# Patient Record
Sex: Male | Born: 1985 | Race: Black or African American | Hispanic: No | Marital: Married | Smoking: Former smoker
Health system: Southern US, Community
[De-identification: ages and names within clinical notes are randomized; demographics above are authoritative.]

## PROBLEM LIST (undated history)

## (undated) DIAGNOSIS — L509 Urticaria, unspecified: Secondary | ICD-10-CM

## (undated) DIAGNOSIS — F319 Bipolar disorder, unspecified: Secondary | ICD-10-CM

## (undated) DIAGNOSIS — R569 Unspecified convulsions: Secondary | ICD-10-CM

## (undated) DIAGNOSIS — J45909 Unspecified asthma, uncomplicated: Secondary | ICD-10-CM

## (undated) HISTORY — DX: Urticaria, unspecified: L50.9

## (undated) HISTORY — DX: Bipolar disorder, unspecified: F31.9

---

## 2011-03-12 ENCOUNTER — Emergency Department (HOSPITAL_COMMUNITY)
Admission: EM | Admit: 2011-03-12 | Discharge: 2011-03-12 | Disposition: A | Discharge status: Home / Self Care | Payer: Self-pay | Attending: Emergency Medicine | Admitting: Emergency Medicine

## 2011-03-12 DIAGNOSIS — R0609 Other forms of dyspnea: Secondary | ICD-10-CM | POA: Insufficient documentation

## 2011-03-12 DIAGNOSIS — J309 Allergic rhinitis, unspecified: Secondary | ICD-10-CM | POA: Insufficient documentation

## 2011-03-12 DIAGNOSIS — R0989 Other specified symptoms and signs involving the circulatory and respiratory systems: Secondary | ICD-10-CM | POA: Insufficient documentation

## 2011-03-12 DIAGNOSIS — J45901 Unspecified asthma with (acute) exacerbation: Secondary | ICD-10-CM | POA: Insufficient documentation

## 2011-03-12 DIAGNOSIS — J3489 Other specified disorders of nose and nasal sinuses: Secondary | ICD-10-CM | POA: Insufficient documentation

## 2011-06-16 ENCOUNTER — Emergency Department (HOSPITAL_COMMUNITY)
Admission: EM | Admit: 2011-06-16 | Discharge: 2011-06-16 | Disposition: A | Discharge status: Home / Self Care | Payer: Medicaid Other | Attending: Emergency Medicine | Admitting: Emergency Medicine

## 2011-06-16 DIAGNOSIS — L02219 Cutaneous abscess of trunk, unspecified: Secondary | ICD-10-CM | POA: Insufficient documentation

## 2011-06-16 DIAGNOSIS — J45909 Unspecified asthma, uncomplicated: Secondary | ICD-10-CM | POA: Insufficient documentation

## 2011-06-16 DIAGNOSIS — L03319 Cellulitis of trunk, unspecified: Secondary | ICD-10-CM | POA: Insufficient documentation

## 2015-09-08 ENCOUNTER — Encounter (HOSPITAL_COMMUNITY): Payer: Self-pay | Admitting: Emergency Medicine

## 2015-09-08 ENCOUNTER — Emergency Department (HOSPITAL_COMMUNITY): Payer: Managed Care, Other (non HMO)

## 2015-09-08 ENCOUNTER — Emergency Department (HOSPITAL_COMMUNITY)
Admission: EM | Admit: 2015-09-08 | Discharge: 2015-09-08 | Disposition: A | Discharge status: Home / Self Care | Payer: Managed Care, Other (non HMO) | Attending: Emergency Medicine | Admitting: Emergency Medicine

## 2015-09-08 DIAGNOSIS — M545 Low back pain: Secondary | ICD-10-CM | POA: Diagnosis present

## 2015-09-08 DIAGNOSIS — J45909 Unspecified asthma, uncomplicated: Secondary | ICD-10-CM | POA: Insufficient documentation

## 2015-09-08 DIAGNOSIS — Z880 Allergy status to penicillin: Secondary | ICD-10-CM | POA: Insufficient documentation

## 2015-09-08 DIAGNOSIS — H5713 Ocular pain, bilateral: Secondary | ICD-10-CM | POA: Insufficient documentation

## 2015-09-08 DIAGNOSIS — M5136 Other intervertebral disc degeneration, lumbar region: Secondary | ICD-10-CM | POA: Diagnosis not present

## 2015-09-08 DIAGNOSIS — R569 Unspecified convulsions: Secondary | ICD-10-CM | POA: Diagnosis not present

## 2015-09-08 HISTORY — DX: Unspecified convulsions: R56.9

## 2015-09-08 HISTORY — DX: Unspecified asthma, uncomplicated: J45.909

## 2015-09-08 MED ORDER — DICLOFENAC SODIUM 75 MG PO TBEC: 75.0000 mg | DELAYED_RELEASE_TABLET | Freq: Two times a day (BID) | ORAL | Status: DC

## 2015-09-08 MED ORDER — DEXAMETHASONE 4 MG PO TABS: 4.0000 mg | ORAL_TABLET | Freq: Two times a day (BID) | ORAL | Status: DC

## 2015-09-08 MED ORDER — IBUPROFEN 800 MG PO TABS
800.0000 mg | ORAL_TABLET | Freq: Once | ORAL | Status: AC
  Administered 2015-09-08: 800 mg via ORAL
  Filled 2015-09-08: qty 1

## 2015-09-08 MED ORDER — PREDNISONE 50 MG PO TABS
60.0000 mg | ORAL_TABLET | Freq: Once | ORAL | Status: AC
  Administered 2015-09-08: 60 mg via ORAL
  Filled 2015-09-08 (×2): qty 1

## 2015-09-08 MED ORDER — DIAZEPAM 5 MG PO TABS
5.0000 mg | ORAL_TABLET | Freq: Once | ORAL | Status: AC
  Administered 2015-09-08: 5 mg via ORAL
  Filled 2015-09-08: qty 1

## 2015-09-08 MED ORDER — DIAZEPAM 5 MG PO TABS: ORAL_TABLET | ORAL | Status: DC

## 2015-09-08 NOTE — ED Notes (Signed)
Having leg and back pain for last 7 days, rates pain 7/10.  Denies any injury.  Took Motrin with mild relief.
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
## 2015-09-08 NOTE — Discharge Instructions (Signed)
Your examination and your x-rays suggest degenerative disc disease of the lumbar spine area. Please see the orthopedic specialist listed above, or the orthopedic specialist of your choice as soon as possible for evaluation, and possible MRI evaluation of this problem. Please use the Decadron, Valium, and diclofenac as prescribed. Value may cause drowsiness, please do not drink, drive, operate machinery, but dissipated activities requiring concentration when taking this medication. Please alternate heat and ice to your lower back. Degenerative Disk Disease Degenerative disk disease is a condition caused by the changes that occur in the cushions of the backbone (spinal disks) as you grow older. Spinal disks are soft and compressible disks located between the bones of the spine (vertebrae). They act like shock absorbers. Degenerative disk disease can affect the whole spine. However, the neck and lower back are most commonly affected. Many changes can occur in the spinal disks with aging, such as:  The spinal disks may dry and shrink.  Small tears may occur in the tough, outer covering of the disk (annulus).  The disk space may become smaller due to loss of water.  Abnormal growths in the bone (spurs) may occur. This can put pressure on the nerve roots exiting the spinal canal, causing pain.  The spinal canal may become narrowed. CAUSES  Degenerative disk disease is a condition caused by the changes that occur in the spinal disks with aging. The exact cause is not known, but there is a genetic basis for many patients. Degenerative changes can occur due to loss of fluid in the disk. This makes the disk thinner and reduces the space between the backbones. Small cracks can develop in the outer layer of the disk. This can lead to the breakdown of the disk. You are more likely to get degenerative disk disease if you are overweight. Smoking cigarettes and doing heavy work such as weightlifting can also increase  your risk of this condition. Degenerative changes can start after a sudden injury. Growth of bone spurs can compress the nerve roots and cause pain.  SYMPTOMS  The symptoms vary from person to person. Some people may have no pain, while others have severe pain. The pain may be so severe that it can limit your activities. The location of the pain depends on the part of your backbone that is affected. You will have neck or arm pain if a disk in the neck area is affected. You will have pain in your back, buttocks, or legs if a disk in the lower back is affected. The pain becomes worse while bending, reaching up, or with twisting movements. The pain may start gradually and then get worse as time passes. It may also start after a major or minor injury. You may feel numbness or tingling in the arms or legs.  DIAGNOSIS  Your caregiver will ask you about your symptoms and about activities or habits that may cause the pain. He or she may also ask about any injuries, diseases, or treatments you have had earlier. Your caregiver will examine you to check for the range of movement that is possible in the affected area, to check for strength in your extremities, and to check for sensation in the areas of the arms and legs supplied by different nerve roots. An X-ray of the spine may be taken. Your caregiver may suggest other imaging tests, such as magnetic resonance imaging (MRI), if needed.  TREATMENT  Treatment includes rest, modifying your activities, and applying ice and heat. Your caregiver may prescribe  medicines to reduce your pain and may ask you to do some exercises to strengthen your back. In some cases, you may need surgery. You and your caregiver will decide on the treatment that is best for you. HOME CARE INSTRUCTIONS   Follow proper lifting and walking techniques as advised by your caregiver.  Maintain good posture.  Exercise regularly as advised.  Perform relaxation exercises.  Change your sitting,  standing, and sleeping habits as advised. Change positions frequently.  Lose weight as advised.  Stop smoking if you smoke.  Wear supportive footwear. SEEK MEDICAL CARE IF:  Your pain does not go away within 1 to 4 weeks. SEEK IMMEDIATE MEDICAL CARE IF:   Your pain is severe.  You notice weakness in your arms, hands, or legs.  You begin to lose control of your bladder or bowel movements. MAKE SURE YOU:   Understand these instructions.  Will watch your condition.  Will get help right away if you are not doing well or get worse. Document Released: 09/25/2007 Document Revised: 02/20/2012 Document Reviewed: 04/01/2014 [facility] Patient Information 2015 [place], [place]. This information is not intended to replace advice given to you by your health care provider. Make sure you discuss any questions you have with your health care provider.

## 2015-09-18 ENCOUNTER — Other Ambulatory Visit (HOSPITAL_COMMUNITY): Payer: Self-pay | Admitting: Orthopedic Surgery

## 2015-09-18 DIAGNOSIS — G8929 Other chronic pain: Secondary | ICD-10-CM

## 2015-09-18 DIAGNOSIS — M5441 Lumbago with sciatica, right side: Principal | ICD-10-CM

## 2015-09-18 DIAGNOSIS — M5442 Lumbago with sciatica, left side: Principal | ICD-10-CM

## 2015-10-07 ENCOUNTER — Ambulatory Visit (HOSPITAL_COMMUNITY): Payer: Managed Care, Other (non HMO)

## 2016-01-27 ENCOUNTER — Ambulatory Visit (INDEPENDENT_AMBULATORY_CARE_PROVIDER_SITE_OTHER): Payer: Managed Care, Other (non HMO) | Admitting: Allergy and Immunology

## 2016-01-27 ENCOUNTER — Encounter: Payer: Self-pay | Admitting: Allergy and Immunology

## 2016-01-27 ENCOUNTER — Ambulatory Visit: Payer: Managed Care, Other (non HMO) | Admitting: Allergy and Immunology

## 2016-01-27 VITALS — BP 116/78 | HR 88 | Temp 98.2°F | Resp 24 | Ht 72.44 in | Wt 257.1 lb

## 2016-01-27 DIAGNOSIS — T7800XA Anaphylactic reaction due to unspecified food, initial encounter: Secondary | ICD-10-CM | POA: Insufficient documentation

## 2016-01-27 DIAGNOSIS — J3089 Other allergic rhinitis: Secondary | ICD-10-CM | POA: Diagnosis not present

## 2016-01-27 DIAGNOSIS — H1010 Acute atopic conjunctivitis, unspecified eye: Secondary | ICD-10-CM | POA: Insufficient documentation

## 2016-01-27 DIAGNOSIS — J4540 Moderate persistent asthma, uncomplicated: Secondary | ICD-10-CM | POA: Diagnosis not present

## 2016-01-27 DIAGNOSIS — H1045 Other chronic allergic conjunctivitis: Secondary | ICD-10-CM | POA: Diagnosis not present

## 2016-01-27 MED ORDER — BUDESONIDE-FORMOTEROL FUMARATE 160-4.5 MCG/ACT IN AERO: 2.0000 | INHALATION_SPRAY | Freq: Two times a day (BID) | RESPIRATORY_TRACT | Status: DC

## 2016-01-27 MED ORDER — OLOPATADINE HCL 0.7 % OP SOLN: 1.0000 [drp] | OPHTHALMIC | Status: DC

## 2016-01-27 MED ORDER — ALBUTEROL SULFATE 108 (90 BASE) MCG/ACT IN AEPB: 2.0000 | INHALATION_SPRAY | RESPIRATORY_TRACT | Status: DC | PRN

## 2016-01-27 MED ORDER — MONTELUKAST SODIUM 10 MG PO TABS: 10.0000 mg | ORAL_TABLET | Freq: Every day | ORAL | Status: DC

## 2016-01-27 MED ORDER — FLUTICASONE PROPIONATE 50 MCG/ACT NA SUSP: 2.0000 | Freq: Every day | NASAL | Status: DC

## 2016-01-27 NOTE — Assessment & Plan Note (Signed)
   A prescription has been provided for Symbicort (budesonide/formoterol) 160/4.5 g, 2 inhalations twice a day.  To maximize pulmonary deposition, a spacer has been provided along with instructions for its proper administration with an HFA inhaler.  A prescription has been provided for montelukast (as above).  A prescription has been provided for ProAir Respiclick, 1-2 inhalations every 4-6 hours as needed.  Subjective and objective measures of pulmonary function will be followed and the treatment plan will be adjusted accordingly.

## 2016-01-27 NOTE — Patient Instructions (Addendum)
Perennial and seasonal allergic rhinitis  Aeroallergen avoidance measures have been discussed and provided in written form.  A prescription has been provided for fluticasone nasal spray, 2 sprays per nostril daily as needed. Proper nasal spray technique has been discussed and demonstrated.  A prescription has been provided for montelukast 10 mg daily at bedtime.  Cetirizine 10 mg daily as needed.  The risks and benefits of aeroallergen immunotherapy have been discussed. The patient is motivated to initiate immunotherapy if insurance coverage is favorable. He will let us know how he would like to proceed.  Seasonal allergic conjunctivitis  Treatment plan as outlined above.  A prescription has been provided for Pazeo, one drop per eye daily as needed.  Moderate persistent asthma  A prescription has been provided for Symbicort (budesonide/formoterol) 160/4.5 g, 2 inhalations twice a day.  To maximize pulmonary deposition, a spacer has been provided along with instructions for its proper administration with an HFA inhaler.  A prescription has been provided for montelukast (as above).  A prescription has been provided for ProAir Respiclick, 1-2 inhalations every 4-6 hours as needed.  Subjective and objective measures of pulmonary function will be followed and the treatment plan will be adjusted accordingly.    Return in about 6 weeks (around 03/09/2016), or if symptoms worsen or fail to improve.  Reducing Pollen Exposure  The American Academy of Allergy, Asthma and Immunology suggests the following steps to reduce your exposure to pollen during allergy seasons.    1. Do not hang sheets or clothing out to dry; pollen may collect on these items. 2. Do not mow lawns or spend time around freshly cut grass; mowing stirs up pollen. 3. Keep windows closed at night.  Keep car windows closed while driving. 4. Minimize morning activities outdoors, a time when pollen counts are usually at  their highest. 5. Stay indoors as much as possible when pollen counts or humidity is high and on windy days when pollen tends to remain in the air longer. 6. Use air conditioning when possible.  Many air conditioners have filters that trap the pollen spores. 7. Use a HEPA room air filter to remove pollen form the indoor air you breathe.   Control of House Dust Mite Allergen  House dust mites play a major role in allergic asthma and rhinitis.  They occur in environments with high humidity wherever human skin, the food for dust mites is found. High levels have been detected in dust obtained from mattresses, pillows, carpets, upholstered furniture, bed covers, clothes and soft toys.  The principal allergen of the house dust mite is found in its feces.  A gram of dust may contain 1,000 mites and 250,000 fecal particles.  Mite antigen is easily measured in the air during house cleaning activities.    1. Encase mattresses, including the box spring, and pillow, in an air tight cover.  Seal the zipper end of the encased mattresses with wide adhesive tape. 2. Wash the bedding in water of 130 degrees Farenheit weekly.  Avoid cotton comforters/quilts and flannel bedding: the most ideal bed covering is the dacron comforter. 3. Remove all upholstered furniture from the bedroom. 4. Remove carpets, carpet padding, rugs, and non-washable window drapes from the bedroom.  Wash drapes weekly or use plastic window coverings. 5. Remove all non-washable stuffed toys from the bedroom.  Wash stuffed toys weekly. 6. Have the room cleaned frequently with a vacuum cleaner and a damp dust-mop.  The patient should not be in a room which  is being cleaned and should wait 1 hour after cleaning before going into the room. 7. Close and seal all heating outlets in the bedroom.  Otherwise, the room will become filled with dust-laden air.  An electric heater can be used to heat the room. Reduce indoor humidity to less than 50%.  Do  not use a humidifier.  Control of Dog or Cat Allergen  Avoidance is the best way to manage a dog or cat allergy. If you have a dog or cat and are allergic to dog or cats, consider removing the dog or cat from the home. If you have a dog or cat but don't want to find it a new home, or if your family wants a pet even though someone in the household is allergic, here are some strategies that may help keep symptoms at bay:  1. Keep the pet out of your bedroom and restrict it to only a few rooms. Be advised that keeping the dog or cat in only one room will not limit the allergens to that room. 2. Don't pet, hug or kiss the dog or cat; if you do, wash your hands with soap and water. 3. High-efficiency particulate air (HEPA) cleaners run continuously in a bedroom or living room can reduce allergen levels over time. 4. Regular use of a high-efficiency vacuum cleaner or a central vacuum can reduce allergen levels. 5. Giving your dog or cat a bath at least once a week can reduce airborne allergen.  Control of Mold Allergen  Mold and fungi can grow on a variety of surfaces provided certain temperature and moisture conditions exist.  Outdoor molds grow on plants, decaying vegetation and soil.  The major outdoor mold, Alternaria and Cladosporium, are found in very high numbers during hot and dry conditions.  Generally, a late Summer - Fall peak is seen for common outdoor fungal spores.  Rain will temporarily lower outdoor mold spore count, but counts rise rapidly when the rainy period ends.  The most important indoor molds are Aspergillus and Penicillium.  Dark, humid and poorly ventilated basements are ideal sites for mold growth.  The next most common sites of mold growth are the bathroom and the kitchen.  Outdoor [employer] 2. Use air conditioning and keep windows closed 3. Avoid exposure to decaying vegetation. 4. Avoid leaf raking. 5. Avoid grain handling. 6. Consider wearing a face mask if working  in moldy areas.  Indoor Mold Control 1. Maintain humidity below 50%. 2. Clean washable surfaces with 5% bleach solution. 3. Remove sources e.g. Contaminated carpets.  Control of Cockroach Allergen  Cockroach allergen has been identified as an important cause of acute attacks of asthma, especially in urban settings.  There are fifty-five species of cockroach that exist in the [place], however only three, the [place], [place] species produce allergen that can affect patients with Asthma.  Allergens can be obtained from fecal particles, egg casings and secretions from cockroaches.    1. Remove food sources. 2. Reduce access to water. 3. Seal access and entry points. 4. Spray runways with 0.5-1% Diazinon or Chlorpyrifos 5. Blow boric acid power under stoves and refrigerator. 6. Place bait stations (hydramethylnon) at feeding sites.

## 2016-01-27 NOTE — Assessment & Plan Note (Signed)
   Treatment plan as outlined above.  A prescription has been provided for Pazeo, one drop per eye daily as needed. 

## 2016-01-27 NOTE — Assessment & Plan Note (Addendum)
   Aeroallergen avoidance measures have been discussed and provided in written form.  A prescription has been provided for fluticasone nasal spray, 2 sprays per nostril daily as needed. Proper nasal spray technique has been discussed and demonstrated.  A prescription has been provided for montelukast 10 mg daily at bedtime.  Cetirizine 10 mg daily as needed.  The risks and benefits of aeroallergen immunotherapy have been discussed. The patient is motivated to initiate immunotherapy if insurance coverage is favorable. He will let us know how he would like to proceed.

## 2016-01-27 NOTE — Progress Notes (Signed)
New Patient Note  RE: Gary Mcgee MRN: [id number] DOB: 27-Sep-1986 Date of Office Visit: 01/27/2016  Referring provider: No ref. provider found Primary care provider: Leo Grosser, MD  Chief Complaint: Asthma and Allergic Rhinitis    History of present illness: HPI Comments: Gary Mcgee is a 30 y.o. male who presents today for evaluation of rhinitis and asthma.  He reports that he had started aeroallergen immunotherapy in 2008 while in [place], however had to discontinue this treatment due to a change in insurance.  He never made it to his maintenance dose immunotherapy.  He reports that since discontinuing immunotherapy has allergic rhinitis and asthma symptoms have progressed.  He experiences nasal congestion, rhinorrhea, sneezing, postnasal drainage, irritated throat, and ocular pruritus.  The symptoms occur year around but tend to increase in the springtime and in the summer.  He has been treated in the emergency department approximately 15 times over this and of his lifetime for asthma exacerbations, most recently in 2014.  Triggers for his asthma include upper respiratory tract infections, allergen exposure, and exercise.  He currently does not have asthma medications but uses his wife's albuterol rescue inhaler 2-3 times per week.  He experiences nocturnal awakenings due to lower respiratory symptoms 2 times per month on average.   Assessment and plan: Perennial and seasonal allergic rhinitis  Aeroallergen avoidance measures have been discussed and provided in written form.  A prescription has been provided for fluticasone nasal spray, 2 sprays per nostril daily as needed. Proper nasal spray technique has been discussed and demonstrated.  A prescription has been provided for montelukast 10 mg daily at bedtime.  Cetirizine 10 mg daily as needed.  The risks and benefits of aeroallergen immunotherapy have been discussed. The patient is motivated to initiate  immunotherapy if insurance coverage is favorable. He will let us know how he would like to proceed.  Seasonal allergic conjunctivitis  Treatment plan as outlined above.  A prescription has been provided for Pazeo, one drop per eye daily as needed.  Moderate persistent asthma  A prescription has been provided for Symbicort (budesonide/formoterol) 160/4.5 g, 2 inhalations twice a day.  To maximize pulmonary deposition, a spacer has been provided along with instructions for its proper administration with an HFA inhaler.  A prescription has been provided for montelukast (as above).  A prescription has been provided for ProAir Respiclick, 1-2 inhalations every 4-6 hours as needed.  Subjective and objective measures of pulmonary function will be followed and the treatment plan will be adjusted accordingly.    Meds ordered this encounter  Medications  . montelukast (SINGULAIR) 10 MG tablet    Sig: Take 1 tablet (10 mg total) by mouth at bedtime.    Dispense:  30 tablet    Refill:  3  . budesonide-formoterol (SYMBICORT) 160-4.5 MCG/ACT inhaler    Sig: Inhale 2 puffs into the lungs 2 (two) times daily.    Dispense:  1 Inhaler    Refill:  5  . Albuterol Sulfate (PROAIR RESPICLICK) 108 (90 Base) MCG/ACT AEPB    Sig: Inhale 2 puffs into the lungs as needed.    Dispense:  1 each    Refill:  1  . Olopatadine HCl (PAZEO) 0.7 % SOLN    Sig: Place 1 drop into both eyes 1 day or 1 dose.    Dispense:  1 Bottle    Refill:  5  . fluticasone (FLONASE) 50 MCG/ACT nasal spray    Sig: Place 2 sprays into both nostrils  daily.    Dispense:  16 g    Refill:  5    Diagnositics: Spirometry: FVC was 4.37 L and FEV1 was 3.25 L (80% predicted) with significant (420 mL, 13%) post bronchodilator improvement. Allergy skin testing: Positive to grass pollen, weed pollen, ragweed pollen, tree pollen, mold, cat hair, dust mite, cockroach antigen.    Physical examination: Blood pressure 116/78, pulse 88,  temperature 98.2 F (36.8 C), temperature source Oral, resp. rate 24, height 6' 0.44" (1.84 m), weight 257 lb 0.9 oz (116.6 kg).  General: Alert, interactive, in no acute distress. HEENT: TMs pearly gray, turbinates edematous with thick discharge, post-pharynx erythematous. Neck: Supple without lymphadenopathy. Lungs: Mildly decreased breath sounds bilaterally without wheezing, rhonchi or rales. CV: Normal S1, S2 without murmurs. Abdomen: Nondistended, nontender. Skin: Warm and dry, without lesions or rashes. Extremities:  No clubbing, cyanosis or edema. Neuro:   Grossly intact.  Review of systems: Review of Systems  Constitutional: Negative for fever, chills and weight loss.  HENT: Positive for congestion. Negative for nosebleeds.   Eyes: Negative for blurred vision.  Respiratory: Positive for cough, sputum production, shortness of breath and wheezing. Negative for hemoptysis.   Cardiovascular: Negative for chest pain.  Gastrointestinal: Negative for diarrhea and constipation.  Genitourinary: Negative for dysuria.  Musculoskeletal: Negative for myalgias and joint pain.  Neurological: Negative for dizziness.  Endo/Heme/Allergies: Positive for environmental allergies. Does not bruise/bleed easily.    Past medical history: Past Medical History  Diagnosis Date  . Asthma   . Seizures (HCC)   . Urticaria     Past surgical history: History reviewed. No pertinent past surgical history.  Family history: Family History  Problem Relation Age of Onset  . Asthma Mother   . Urticaria Mother   . Asthma Father   . Allergic rhinitis Neg Hx   . Eczema Neg Hx   . Immunodeficiency Neg Hx     Social history: Social History   Social History  . Marital Status: Married    Spouse Name: N/A  . Number of Children: N/A  . Years of Education: N/A   Occupational History  . Not on file.   Social History Main Topics  . Smoking status: Current Some Day Smoker  . Smokeless tobacco:  Never Used  . Alcohol Use: 1.2 oz/week    2 Cans of beer per week  . Drug Use: No  . Sexual Activity: Yes   Other Topics Concern  . Not on file   Social History Narrative   Environmental History: The patient lives in an 30 year old house with hardwood floors throughout, kerosene heating, and window air conditioning units.  There are 2 dogs in house which have access to the bedroom. He is a former smoker.    Medication List       This list is accurate as of: 01/27/16 12:43 PM.  Always use your most recent med list.               Albuterol Sulfate 108 (90 Base) MCG/ACT Aepb  Commonly known as:  PROAIR RESPICLICK  Inhale 2 puffs into the lungs as needed.     budesonide-formoterol 160-4.5 MCG/ACT inhaler  Commonly known as:  SYMBICORT  Inhale 2 puffs into the lungs 2 (two) times daily.     dexamethasone 4 MG tablet  Commonly known as:  DECADRON  Take 1 tablet (4 mg total) by mouth 2 (two) times daily with a meal.     diazepam 5 MG tablet  Commonly  known as:  VALIUM  1 po tid for spasm pain     diclofenac 75 MG EC tablet  Commonly known as:  VOLTAREN  Take 1 tablet (75 mg total) by mouth 2 (two) times daily.     fluticasone 50 MCG/ACT nasal spray  Commonly known as:  FLONASE  Place 2 sprays into both nostrils daily.     montelukast 10 MG tablet  Commonly known as:  SINGULAIR  Take 1 tablet (10 mg total) by mouth at bedtime.     Olopatadine HCl 0.7 % Soln  Commonly known as:  PAZEO  Place 1 drop into both eyes 1 day or 1 dose.        Known medication allergies: Allergies  Allergen Reactions  . Amoxicillin Hives    I appreciate the opportunity to take part in this [facility]'s care. Please do not hesitate to contact me with questions.  Sincerely,   R. Jorene Guest, MD

## 2016-02-01 ENCOUNTER — Encounter: Payer: Self-pay | Admitting: *Deleted

## 2016-02-03 ENCOUNTER — Telehealth: Payer: Self-pay

## 2016-02-03 NOTE — Addendum Note (Signed)
Addended by: Donavan Foil on: 02/03/2016 07:38 PM   Modules accepted: Orders

## 2016-02-03 NOTE — Telephone Encounter (Signed)
Immunotherapy orders have been submitted.

## 2016-02-04 DIAGNOSIS — J3089 Other allergic rhinitis: Secondary | ICD-10-CM | POA: Diagnosis not present

## 2016-02-05 DIAGNOSIS — J301 Allergic rhinitis due to pollen: Secondary | ICD-10-CM | POA: Diagnosis not present

## 2016-02-08 ENCOUNTER — Other Ambulatory Visit: Payer: Self-pay

## 2016-02-08 MED ORDER — EPINEPHRINE 0.3 MG/0.3ML IJ SOAJ: 0.3000 mg | Freq: Once | INTRAMUSCULAR | Status: DC

## 2016-02-29 ENCOUNTER — Encounter: Payer: Managed Care, Other (non HMO) | Admitting: Family Medicine

## 2016-03-03 ENCOUNTER — Encounter: Payer: Self-pay | Admitting: Family Medicine

## 2016-03-03 ENCOUNTER — Ambulatory Visit (INDEPENDENT_AMBULATORY_CARE_PROVIDER_SITE_OTHER): Payer: Managed Care, Other (non HMO) | Admitting: Family Medicine

## 2016-03-03 VITALS — BP 130/84 | HR 76 | Temp 98.2°F | Resp 18 | Ht 74.0 in | Wt 262.0 lb

## 2016-03-03 DIAGNOSIS — Z0000 Encounter for general adult medical examination without abnormal findings: Secondary | ICD-10-CM | POA: Diagnosis not present

## 2016-03-03 DIAGNOSIS — G40909 Epilepsy, unspecified, not intractable, without status epilepticus: Secondary | ICD-10-CM | POA: Diagnosis not present

## 2016-03-03 DIAGNOSIS — Z23 Encounter for immunization: Secondary | ICD-10-CM | POA: Diagnosis not present

## 2016-03-03 DIAGNOSIS — Z8659 Personal history of other mental and behavioral disorders: Secondary | ICD-10-CM | POA: Diagnosis not present

## 2016-03-03 NOTE — Progress Notes (Signed)
Subjective:    Patient ID: Gary Mcgee, male    DOB: 1986-06-30, 30 y.o.   MRN: [id number]  HPI Patient is a very pleasant 30 year old African-American male here today to establish care. He has no major concerns. He does have numerous keloids on his chest. However he does not want to see a dermatologist at the present time for intralesional corticosteroid injections. Past medical history is significant for bipolar disorder. He also has a history of seizures. He states that he was diagnosed with seizures as a child. He was told he had epilepsy. His last seizure was in 2010. He quit his seizure medication in 2011. He refuses to take anti-seizure medication. We discussed the risk of recurrent seizure but he still is determined not to take seizure medication. Past Medical History  Diagnosis Date  . Asthma   . Seizures (HCC)   . Urticaria   . Bipolar disorder (HCC)    No past surgical history on file. Current Outpatient Prescriptions on File Prior to Visit  Medication Sig Dispense Refill  . Albuterol Sulfate (PROAIR RESPICLICK) 108 (90 Base) MCG/ACT AEPB Inhale 2 puffs into the lungs as needed. 1 each 1  . budesonide-formoterol (SYMBICORT) 160-4.5 MCG/ACT inhaler Inhale 2 puffs into the lungs 2 (two) times daily. 1 Inhaler 5  . EPINEPHrine 0.3 mg/0.3 mL IJ SOAJ injection Inject 0.3 mLs (0.3 mg total) into the muscle once. 2 Device 2  . montelukast (SINGULAIR) 10 MG tablet Take 1 tablet (10 mg total) by mouth at bedtime. 30 tablet 3   No current facility-administered medications on file prior to visit.   Allergies  Allergen Reactions  . Shellfish Allergy Anaphylaxis  . Amoxicillin Hives   Social History   Social History  . Marital Status: Married    Spouse Name: N/A  . Number of Children: N/A  . Years of Education: N/A   Occupational History  . Not on file.   Social History Main Topics  . Smoking status: Former Smoker    Types: Cigars  . Smokeless tobacco: Former NeurosurgeonUser    Quit date: 03/13/2015  . Alcohol Use: 1.2 oz/week    2 Cans of beer per week  . Drug Use: No  . Sexual Activity: Yes   Other Topics Concern  . Not on file   Social History Narrative   Family History  Problem Relation Age of Onset  . Asthma Mother   . Urticaria Mother   . Hypertension Mother   . Asthma Father   . Seizures Father   . Allergic rhinitis Neg Hx   . Eczema Neg Hx   . Immunodeficiency Neg Hx   . Cancer Maternal Grandfather     prostate  . Cancer Paternal Grandmother     colon  . Cancer Paternal Grandfather     prostate      Review of Systems  All other systems reviewed and are negative.      Objective:   Physical Exam  Constitutional: He is oriented to person, place, and time. He appears well-developed and well-nourished. No distress.  HENT:  Head: Normocephalic and atraumatic.  Right Ear: External ear normal.  Left Ear: External ear normal.  Nose: Nose normal.  Mouth/Throat: Oropharynx is clear and moist. No oropharyngeal exudate.  Eyes: Conjunctivae and EOM are normal. Pupils are equal, round, and reactive to light. Right eye exhibits no discharge. Left eye exhibits no discharge. No scleral icterus.  Neck: Normal range of motion. Neck supple. No JVD present. No  tracheal deviation present. No thyromegaly present.  Cardiovascular: Normal rate, regular rhythm, normal heart sounds and intact distal pulses.  Exam reveals no gallop and no friction rub.   No murmur heard. Pulmonary/Chest: Effort normal and breath sounds normal. No stridor. No respiratory distress. He has no wheezes. He has no rales. He exhibits no tenderness.  Abdominal: Soft. Bowel sounds are normal. He exhibits no distension and no mass. There is no tenderness. There is no rebound and no guarding.  Genitourinary: Testes normal and penis normal.  Musculoskeletal: Normal range of motion. He exhibits no edema or tenderness.  Lymphadenopathy:    He has no cervical adenopathy.  Neurological:  He is alert and oriented to person, place, and time. He has normal reflexes. He displays normal reflexes. No cranial nerve deficit. He exhibits normal muscle tone. Coordination normal.  Skin: Skin is warm. Rash noted. He is not diaphoretic. No erythema. No pallor.  Psychiatric: He has a normal mood and affect. His behavior is normal. Judgment and thought content normal.  Vitals reviewed.         Assessment & Plan:  Gen med exam Establish care History of seizures Bipolar disorder  Offer the patient a flu shot but he declined. He did consent to receive the tetanus vaccine. I have asked him to return fasting for a CBC, CMP, fasting lipid panel. Family history is significant for prostate cancer. I would recommend prostate cancer screening when he turns 40. He is due for colonoscopy at 50. Patient sees a psychiatrist at [facility] who checks his TSH and lithium level per his report. I strongly recommended that he resume seizure medication given the likelihood of future seizures but he declines. I recommended diet exercise and weight loss.

## 2016-03-08 ENCOUNTER — Encounter: Payer: Self-pay | Admitting: Family Medicine

## 2016-03-09 ENCOUNTER — Ambulatory Visit: Payer: Managed Care, Other (non HMO) | Admitting: Allergy and Immunology

## 2016-05-12 ENCOUNTER — Emergency Department (HOSPITAL_COMMUNITY)
Admission: EM | Admit: 2016-05-12 | Discharge: 2016-05-12 | Disposition: A | Discharge status: Home / Self Care | Payer: Managed Care, Other (non HMO) | Attending: Emergency Medicine | Admitting: Emergency Medicine

## 2016-05-12 ENCOUNTER — Encounter (HOSPITAL_COMMUNITY): Payer: Self-pay | Admitting: Emergency Medicine

## 2016-05-12 DIAGNOSIS — J45909 Unspecified asthma, uncomplicated: Secondary | ICD-10-CM | POA: Diagnosis not present

## 2016-05-12 DIAGNOSIS — Z79899 Other long term (current) drug therapy: Secondary | ICD-10-CM | POA: Insufficient documentation

## 2016-05-12 DIAGNOSIS — M5440 Lumbago with sciatica, unspecified side: Secondary | ICD-10-CM | POA: Diagnosis not present

## 2016-05-12 DIAGNOSIS — M5136 Other intervertebral disc degeneration, lumbar region: Secondary | ICD-10-CM

## 2016-05-12 DIAGNOSIS — Z87891 Personal history of nicotine dependence: Secondary | ICD-10-CM | POA: Diagnosis not present

## 2016-05-12 DIAGNOSIS — F319 Bipolar disorder, unspecified: Secondary | ICD-10-CM | POA: Diagnosis not present

## 2016-05-12 DIAGNOSIS — M545 Low back pain: Secondary | ICD-10-CM

## 2016-05-12 MED ORDER — DIAZEPAM 5 MG PO TABS
10.0000 mg | ORAL_TABLET | Freq: Once | ORAL | Status: AC
  Administered 2016-05-12: 10 mg via ORAL
  Filled 2016-05-12: qty 2

## 2016-05-12 MED ORDER — ONDANSETRON HCL 4 MG PO TABS
4.0000 mg | ORAL_TABLET | Freq: Once | ORAL | Status: AC
  Administered 2016-05-12: 4 mg via ORAL
  Filled 2016-05-12: qty 1

## 2016-05-12 MED ORDER — DICLOFENAC SODIUM 75 MG PO TBEC: 75.0000 mg | DELAYED_RELEASE_TABLET | Freq: Two times a day (BID) | ORAL | Status: DC

## 2016-05-12 MED ORDER — INDOMETHACIN 25 MG PO CAPS
25.0000 mg | ORAL_CAPSULE | Freq: Once | ORAL | Status: AC
  Administered 2016-05-12: 25 mg via ORAL
  Filled 2016-05-12: qty 1

## 2016-05-12 MED ORDER — METHOCARBAMOL 500 MG PO TABS: 500.0000 mg | ORAL_TABLET | Freq: Three times a day (TID) | ORAL | Status: DC

## 2016-05-12 MED ORDER — DEXAMETHASONE 4 MG PO TABS: 4.0000 mg | ORAL_TABLET | Freq: Two times a day (BID) | ORAL | Status: DC

## 2016-05-12 MED ORDER — PREDNISONE 50 MG PO TABS
60.0000 mg | ORAL_TABLET | Freq: Once | ORAL | Status: AC
  Administered 2016-05-12: 60 mg via ORAL
  Filled 2016-05-12: qty 1

## 2016-05-12 NOTE — Discharge Instructions (Signed)
Your vital signs within normal limits. Your examination favors an exacerbation of your degenerative disc disease accompanied by lumbar spasm. Please use Robaxin 3 times daily until all taken. This medication may cause drowsiness, please do not drive a vehicle, operating machinery, drink alcohol, or participated in activities requiring concentration when taking this medication. Please use Decadron and diclofenac 2 times daily with food. Call your orthopedic specialist at the [facility] and set up an appointment for recheck of your degenerative disc disease. Degenerative Disk Disease Degenerative disk disease is a condition caused by the changes that occur in spinal disks as you grow older. Spinal disks are soft and compressible disks located between the bones of your spine (vertebrae). These disks act like shock absorbers. Degenerative disk disease can affect the whole spine. However, the neck and lower back are most commonly affected. Many changes can occur in the spinal disks with aging, such as:  The spinal disks may dry and shrink.  Small tears may occur in the tough, outer covering of the disk (annulus).  The disk space may become smaller due to loss of water.  Abnormal growths in the bone (spurs) may occur. This can put pressure on the nerve roots exiting the spinal canal, causing pain.  The spinal canal may become narrowed. RISK FACTORS   Being overweight.  Having a family history of degenerative disk disease.  Smoking.  There is increased risk if you are doing heavy lifting or have a sudden injury. SIGNS AND SYMPTOMS  Symptoms vary from person to person and may include:  Pain that varies in intensity. Some people have no pain, while others have severe pain. The location of the pain depends on the part of your backbone that is affected.  You will have neck or arm pain if a disk in the neck area is affected.  You will have pain in your back, buttocks, or legs if a  disk in the lower back is affected.  Pain that becomes worse while bending, reaching up, or with twisting movements.  Pain that may start gradually and then get worse as time passes. It may also start after a major or minor injury.  Numbness or tingling in the arms or legs. DIAGNOSIS  Your health care provider will ask you about your symptoms and about activities or habits that may cause the pain. He or she may also ask about any injuries, diseases, or treatments you have had. Your health care provider will examine you to check for the range of movement that is possible in the affected area, to check for strength in your extremities, and to check for sensation in the areas of the arms and legs supplied by different nerve roots. You may also have:   An X-ray of the spine.  Other imaging tests, such as MRI. TREATMENT  Your health care provider will advise you on the best plan for treatment. Treatment may include:  Medicines.  Rehabilitation exercises. HOME CARE INSTRUCTIONS   Follow proper lifting and walking techniques as advised by your health care provider.  Maintain good posture.  Exercise regularly as advised by your health care provider.  Perform relaxation exercises.  Change your sitting, standing, and sleeping habits as advised by your health care provider.  Change positions frequently.  Lose weight or maintain a healthy weight as advised by your health care provider.  Do not use any tobacco products, including cigarettes, chewing tobacco, or electronic cigarettes. If you need help quitting, ask your health care provider.  Wear supportive footwear.  Take medicines only as directed by your health care provider. SEEK MEDICAL CARE IF:   Your pain does not go away within 1-4 weeks.  You have significant appetite or weight loss. SEEK IMMEDIATE MEDICAL CARE IF:   Your pain is severe.  You notice weakness in your arms, hands, or legs.  You begin to lose control of  your bladder or bowel movements.  You have fevers or night sweats. MAKE SURE YOU:   Understand these instructions.  Will watch your condition.  Will get help right away if you are not doing well or get worse.   This information is not intended to replace advice given to you by your health care provider. Make sure you discuss any questions you have with your health care provider.   Document Released: 09/25/2007 Document Revised: 12/19/2014 Document Reviewed: 04/01/2014 Elsevier Interactive Patient Education [employer]

## 2016-05-12 NOTE — ED Provider Notes (Signed)
CSN: [id number]     Arrival date & time 05/12/16  1329 History   First MD Initiated Contact with Patient 05/12/16 1342     Chief Complaint  Patient presents with  . Back Pain     (Consider location/radiation/quality/duration/timing/severity/associated sxs/prior Treatment) HPI Comments: Pt is a 29y/o male who presents to the ED with c/o lower back pain. Pt c/o lower back pain that has been present for some time, but worse in the last 2 days. Patient states he has been on his back a lot recently working on his wife's car on. He states that the bending causes the pain to be worse. He has a history of back problems, but has not see orthopedic MD recently. No loss of bowel or bladder function. No report of frequent falls. Pain is worse with walking and with movement.  Patient is a 30 y.o. male presenting with back pain. The history is provided by the patient.  Back Pain Location:  Lumbar spine   Past Medical History  Diagnosis Date  . Asthma   . Seizures (HCC)   . Urticaria   . Bipolar disorder (HCC)    History reviewed. No pertinent past surgical history. Family History  Problem Relation Age of Onset  . Asthma Mother   . Urticaria Mother   . Hypertension Mother   . Asthma Father   . Seizures Father   . Allergic rhinitis Neg Hx   . Eczema Neg Hx   . Immunodeficiency Neg Hx   . Cancer Maternal Grandfather     prostate  . Cancer Paternal Grandmother     colon  . Cancer Paternal Grandfather     prostate   Social History  Substance Use Topics  . Smoking status: Former Smoker    Types: Cigars  . Smokeless tobacco: Former Neurosurgeon    Quit date: 03/13/2015  . Alcohol Use: 1.2 oz/week    2 Cans of beer per week     Comment: weekends    Review of Systems  Musculoskeletal: Positive for back pain.  Neurological: Positive for seizures.  Psychiatric/Behavioral:       Bipolar disorder  All other systems reviewed and are negative.     Allergies  Shellfish allergy and  Amoxicillin  Home Medications   Prior to Admission medications   Medication Sig Start Date End Date Taking? Authorizing Provider  Albuterol Sulfate (PROAIR RESPICLICK) 108 (90 Base) MCG/ACT AEPB Inhale 2 puffs into the lungs as needed. 01/27/16  Yes Cristal Ford, MD  budesonide-formoterol [facility]) 160-4.5 MCG/ACT inhaler Inhale 2 puffs into the lungs 2 (two) times daily. 01/27/16  Yes Cristal Ford, MD  EPINEPHrine 0.3 mg/0.3 mL IJ SOAJ injection Inject 0.3 mLs (0.3 mg total) into the muscle once. 02/08/16  Yes Cristal Ford, MD  lithium 600 MG capsule Take 600 mg by mouth 2 (two) times daily with a meal.   Yes Historical Provider, MD  montelukast (SINGULAIR) 10 MG tablet Take 1 tablet (10 mg total) by mouth at bedtime. 01/27/16  Yes Cristal Ford, MD  topiramate (TOPAMAX) 100 MG tablet Take 100 mg by mouth at bedtime.   Yes Historical Provider, MD  topiramate (TOPAMAX) 25 MG tablet Take 25 mg by mouth every morning.   Yes Historical Provider, MD   BP 126/74 mmHg  Pulse 81  Temp(Src) 98.7 F (37.1 C) (Oral)  Resp 16  Ht  (1.88 m)  Wt 115.667 kg  BMI 32.73 kg/m2  SpO2 100% Physical Exam  Constitutional: He is oriented to person, place, and time. He appears well-developed and well-nourished.  Non-toxic appearance.  HENT:  Head: Normocephalic.  Right Ear: Tympanic membrane and external ear normal.  Left Ear: Tympanic membrane and external ear normal.  Eyes: EOM and lids are normal. Pupils are equal, round, and reactive to light.  Neck: Normal range of motion. Neck supple. Carotid bruit is not present.  Cardiovascular: Normal rate, regular rhythm, normal heart sounds, intact distal pulses and normal pulses.   Pulmonary/Chest: Breath sounds normal. No respiratory distress.  Abdominal: Soft. Bowel sounds are normal. There is no tenderness. There is no guarding.  Musculoskeletal:       Lumbar back: He exhibits decreased range of motion, tenderness, pain  and spasm.  Lymphadenopathy:       Head (right side): No submandibular adenopathy present.       Head (left side): No submandibular adenopathy present.    He has no cervical adenopathy.  Neurological: He is alert and oriented to person, place, and time. He has normal strength. No cranial nerve deficit or sensory deficit.  Skin: Skin is warm and dry.  Psychiatric: He has a normal mood and affect. His speech is normal.  Nursing note and vitals reviewed.   ED Course  Procedures (including critical care time) Labs Review Labs Reviewed - No data to display  Imaging Review No results found. I have personally reviewed and evaluated these images and lab results as part of my medical decision-making.   EKG Interpretation None      MDM  Vital signs are within normal limits. Pulse oximetry is 100% on room air. Within normal limits by my interpretation. No gross neurologic deficit appreciated on today's examination. No evidence for caudal equina, or other emergent changes a situations.  The patient is encouraged to rest his back is much as possible. Prescription for Decadron, Robaxin, and diclofenac given to the patient. I've asked him to see his physicians at the [facility] for recheck and evaluation concerning his degenerative disc disease. Patient is in agreement with this discharge plan.    Final diagnoses:  Low back pain, unspecified back pain laterality, with sciatica presence unspecified  DDD (degenerative disc disease), lumbar    *I have reviewed nursing notes, vital signs, and all appropriate lab and imaging results for this patient.[address]**    Rylynn Schoneman, PA-C 05/12/16 1429  Eber HongBrian Miller, MD 05/13/16 [phone]

## 2016-05-12 NOTE — ED Notes (Signed)
Pt made aware to return if symptoms worsen or if any life threatening symptoms occur.   

## 2016-05-12 NOTE — ED Notes (Signed)
PT c/o lower back pain unrelieved x2 days with no new injury and pt states history of back pain. PT ambulatory in triage with NAD noted.

## 2018-04-16 ENCOUNTER — Emergency Department (HOSPITAL_COMMUNITY)
Admission: EM | Admit: 2018-04-16 | Discharge: 2018-04-16 | Disposition: A | Discharge status: Home / Self Care | Payer: Self-pay | Attending: Emergency Medicine | Admitting: Emergency Medicine

## 2018-04-16 ENCOUNTER — Emergency Department (HOSPITAL_COMMUNITY): Payer: Self-pay

## 2018-04-16 ENCOUNTER — Other Ambulatory Visit: Payer: Self-pay

## 2018-04-16 ENCOUNTER — Encounter (HOSPITAL_COMMUNITY): Payer: Self-pay | Admitting: Emergency Medicine

## 2018-04-16 DIAGNOSIS — Z79899 Other long term (current) drug therapy: Secondary | ICD-10-CM | POA: Insufficient documentation

## 2018-04-16 DIAGNOSIS — J4541 Moderate persistent asthma with (acute) exacerbation: Secondary | ICD-10-CM | POA: Insufficient documentation

## 2018-04-16 DIAGNOSIS — Z87891 Personal history of nicotine dependence: Secondary | ICD-10-CM | POA: Insufficient documentation

## 2018-04-16 MED ORDER — IPRATROPIUM-ALBUTEROL 0.5-2.5 (3) MG/3ML IN SOLN
3.0000 mL | Freq: Once | RESPIRATORY_TRACT | Status: AC
Start: 2018-04-16 — End: 2018-04-16
  Administered 2018-04-16: 3 mL via RESPIRATORY_TRACT
  Filled 2018-04-16: qty 3

## 2018-04-16 MED ORDER — IPRATROPIUM-ALBUTEROL 0.5-2.5 (3) MG/3ML IN SOLN
3.0000 mL | Freq: Once | RESPIRATORY_TRACT | Status: AC
  Administered 2018-04-16: 3 mL via RESPIRATORY_TRACT
  Filled 2018-04-16: qty 3

## 2018-04-16 MED ORDER — PREDNISONE 50 MG PO TABS
60.0000 mg | ORAL_TABLET | Freq: Once | ORAL | Status: AC
  Administered 2018-04-16: 60 mg via ORAL
  Filled 2018-04-16: qty 1

## 2018-04-16 MED ORDER — ALBUTEROL SULFATE HFA 108 (90 BASE) MCG/ACT IN AERS: 2.0000 | INHALATION_SPRAY | RESPIRATORY_TRACT | 0 refills | Status: DC | PRN

## 2018-04-16 MED ORDER — ALBUTEROL SULFATE (2.5 MG/3ML) 0.083% IN NEBU: 2.5000 mg | INHALATION_SOLUTION | Freq: Once | RESPIRATORY_TRACT | Status: DC

## 2018-04-16 MED ORDER — ALBUTEROL SULFATE HFA 108 (90 BASE) MCG/ACT IN AERS
2.0000 | INHALATION_SPRAY | RESPIRATORY_TRACT | Status: DC
  Administered 2018-04-16: 2 via RESPIRATORY_TRACT
  Filled 2018-04-16: qty 6.7

## 2018-04-16 MED ORDER — PREDNISONE 10 MG PO TABS: ORAL_TABLET | ORAL | 0 refills | Status: DC

## 2018-04-16 NOTE — ED Notes (Signed)
Pt noted to continue to have significant wheezing.  PA made aware.  Pt states he feels better, but still tight.

## 2018-04-16 NOTE — ED Triage Notes (Signed)
Pt state having a productive cough and congestion x2 weeks with black sputum

## 2018-04-16 NOTE — ED Provider Notes (Signed)
[facility] EMERGENCY DEPARTMENT Provider Note   CSN: [id number] Arrival date & time: 04/16/18  0744     History   Chief Complaint Chief Complaint  Patient presents with  . Cough    HPI Gary Mcgee is a 32 y.o. male.  The history is provided by the patient. No language interpreter was used.  Cough  This is a new problem. The cough is productive of sputum. The maximum temperature recorded prior to his arrival was 100 to 100.9 F. He has tried nothing for the symptoms. The treatment provided no relief. He is not a smoker. His past medical history is significant for bronchitis and asthma.  Pt complains of a cough and swelling.   Past Medical History:  Diagnosis Date  . Asthma   . Bipolar disorder (HCC)   . Seizures (HCC)   . Urticaria     Patient Active Problem List   Diagnosis Date Noted  . Perennial and seasonal allergic rhinitis 01/27/2016  . Seasonal allergic conjunctivitis 01/27/2016  . Moderate persistent asthma 01/27/2016    History reviewed. No pertinent surgical history.      Home Medications    Prior to Admission medications   Medication Sig Start Date End Date Taking? Authorizing Provider  Albuterol Sulfate (PROAIR RESPICLICK) 108 (90 Base) MCG/ACT AEPB Inhale 2 puffs into the lungs as needed. 01/27/16   Bobbitt, Heywood Iles, MD  budesonide-formoterol [facility]) 160-4.5 MCG/ACT inhaler Inhale 2 puffs into the lungs 2 (two) times daily. 01/27/16   Bobbitt, Heywood Iles, MD  dexamethasone (DECADRON) 4 MG tablet Take 1 tablet (4 mg total) by mouth 2 (two) times daily with a meal. 05/12/16   Ivery Quale, PA-C  diclofenac (VOLTAREN) 75 MG EC tablet Take 1 tablet (75 mg total) by mouth 2 (two) times daily. 05/12/16   Ivery Quale, PA-C  EPINEPHrine 0.3 mg/0.3 mL IJ SOAJ injection Inject 0.3 mLs (0.3 mg total) into the muscle once. 02/08/16   Bobbitt, Heywood Iles, MD  lithium 600 MG capsule Take 600 mg by mouth 2 (two) times daily with a meal.    [provider]  methocarbamol (ROBAXIN) 500 MG tablet Take 1 tablet (500 mg total) by mouth 3 (three) times daily. 05/12/16   Ivery Quale, PA-C  montelukast (SINGULAIR) 10 MG tablet Take 1 tablet (10 mg total) by mouth at bedtime. 01/27/16   Bobbitt, Heywood Iles, MD  topiramate (TOPAMAX) 100 MG tablet Take 100 mg by mouth at bedtime.    [provider]  topiramate (TOPAMAX) 25 MG tablet Take 25 mg by mouth every morning.    [provider]    Family History Family History  Problem Relation Age of Onset  . Asthma Mother   . Urticaria Mother   . Hypertension Mother   . Asthma Father   . Seizures Father   . Cancer Maternal Grandfather        prostate  . Cancer Paternal Grandmother        colon  . Cancer Paternal Grandfather        prostate  . Allergic rhinitis Neg Hx   . Eczema Neg Hx   . Immunodeficiency Neg Hx     Social History Social History   Tobacco Use  . Smoking status: Former Smoker    Types: Cigars  . Smokeless tobacco: Former Neurosurgeon    Quit date: 03/13/2015  Substance Use Topics  . Alcohol use: Yes    Alcohol/week: 1.2 oz    Types: 2 Cans  of beer per week    Comment: weekends  . Drug use: No     Allergies   Shellfish allergy and Amoxicillin   Review of Systems Review of Systems  Respiratory: Positive for cough.   All other systems reviewed and are negative.    Physical Exam Updated Vital Signs BP (!) 138/95 (BP Location: Right Arm)   Pulse 71   Temp 97.9 F (36.6 C) (Oral)   Resp 20   Ht  (1.88 m)   Wt 122.5 kg (270 lb)   SpO2 95%   BMI 34.67 kg/m   Physical Exam  Constitutional: He appears well-developed and well-nourished.  HENT:  Head: Normocephalic.  Right Ear: External ear normal.  Left Ear: External ear normal.  Nose: Nose normal.  Mouth/Throat: Oropharynx is clear and moist.  Eyes: Pupils are equal, round, and reactive to light.  Neck: Normal range of motion.  Cardiovascular: Normal rate.    Pulmonary/Chest: Effort normal.  Abdominal: Soft.  Musculoskeletal: Normal range of motion.  Neurological: He is alert.  Skin: Skin is warm.  Psychiatric: He has a normal mood and affect.  Nursing note and vitals reviewed.    ED Treatments / Results  Labs (all labs ordered are listed, but only abnormal results are displayed) Labs Reviewed - No data to display  EKG None  Radiology Dg Chest 2 View  Result Date: 04/16/2018 CLINICAL DATA:  Cough and congestion for 2 weeks EXAM: CHEST - 2 VIEW COMPARISON:  None. FINDINGS: The heart size and mediastinal contours are within normal limits. Both lungs are clear. The visualized skeletal structures are unremarkable. IMPRESSION: No active cardiopulmonary disease. Electronically Signed   By: Alcide Clever M.D.   On: 04/16/2018 08:28    Procedures Procedures (including critical care time)  Medications Ordered in ED Medications  ipratropium-albuterol (DUONEB) 0.5-2.5 (3) MG/3ML nebulizer solution 3 mL (3 mLs Nebulization Given 04/16/18 0855)  predniSONE (DELTASONE) tablet 60 mg (60 mg Oral Given 04/16/18 0848)     Initial Impression / Assessment and Plan / ED Course  I have reviewed the triage vital signs and the nursing notes.  Pertinent labs & imaging results that were available during my care of the patient were reviewed by me and considered in my medical decision making (see chart for details).     Pt given duoneb and prednisone 60 mg.   Pt advised to followup with his Md for recheck.    Final Clinical Impressions(s) / ED Diagnoses   Final diagnoses:  Moderate persistent asthma with exacerbation    ED Discharge Orders        Ordered    albuterol (PROVENTIL HFA;VENTOLIN HFA) 108 (90 Base) MCG/ACT inhaler  Every 4 hours PRN     04/16/18 1006    predniSONE (DELTASONE) 10 MG tablet     04/16/18 [address], [place] 04/16/18 1006    Donnetta Hutching, MD 04/16/18 [phone]

## 2018-04-16 NOTE — Discharge Instructions (Addendum)
Return if any problems.

## 2019-04-13 ENCOUNTER — Encounter (HOSPITAL_COMMUNITY): Payer: Self-pay | Admitting: Emergency Medicine

## 2019-04-13 ENCOUNTER — Emergency Department (HOSPITAL_COMMUNITY): Payer: Self-pay

## 2019-04-13 ENCOUNTER — Other Ambulatory Visit: Payer: Self-pay

## 2019-04-13 ENCOUNTER — Emergency Department (HOSPITAL_COMMUNITY)
Admission: EM | Admit: 2019-04-13 | Discharge: 2019-04-14 | Disposition: A | Discharge status: Home / Self Care | Payer: Self-pay | Attending: Emergency Medicine | Admitting: Emergency Medicine

## 2019-04-13 DIAGNOSIS — Z79899 Other long term (current) drug therapy: Secondary | ICD-10-CM | POA: Insufficient documentation

## 2019-04-13 DIAGNOSIS — J4541 Moderate persistent asthma with (acute) exacerbation: Secondary | ICD-10-CM | POA: Insufficient documentation

## 2019-04-13 DIAGNOSIS — Z87891 Personal history of nicotine dependence: Secondary | ICD-10-CM | POA: Insufficient documentation

## 2019-04-13 MED ORDER — ALBUTEROL SULFATE HFA 108 (90 BASE) MCG/ACT IN AERS
2.0000 | INHALATION_SPRAY | RESPIRATORY_TRACT | Status: DC | PRN
  Administered 2019-04-13: 2 via RESPIRATORY_TRACT
  Filled 2019-04-13: qty 6.7

## 2019-04-13 MED ORDER — PREDNISONE 20 MG PO TABS: 40.0000 mg | ORAL_TABLET | Freq: Every day | ORAL | 0 refills | Status: DC

## 2019-04-13 MED ORDER — ALBUTEROL SULFATE HFA 108 (90 BASE) MCG/ACT IN AERS: 2.0000 | INHALATION_SPRAY | RESPIRATORY_TRACT | 3 refills | Status: DC | PRN

## 2019-04-13 MED ORDER — DEXAMETHASONE SODIUM PHOSPHATE 10 MG/ML IJ SOLN
10.0000 mg | Freq: Once | INTRAMUSCULAR | Status: AC
Start: 2019-04-13 — End: 2019-04-13
  Administered 2019-04-13: 10 mg via INTRAMUSCULAR
  Filled 2019-04-13: qty 1

## 2019-04-13 NOTE — ED Provider Notes (Signed)
[facility] EMERGENCY DEPARTMENT Provider Note   CSN: [id number] Arrival date & time: 04/13/19  2228    History   Chief Complaint Chief Complaint  Patient presents with  . Shortness of Breath    HPI Gary Mcgee is a 33 y.o. male.     Patient presents to the emergency department for evaluation of shortness of breath.  Patient does have a history of asthma, reports that he feels like his breathing is tight and he is wheezing.  Symptoms have been ongoing for 1 week, have worsened over the last 2 days because he ran out of his albuterol inhaler.  He has had a slight nonproductive cough.  No fever.  No chest pain.     Past Medical History:  Diagnosis Date  . Asthma   . Bipolar disorder (HCC)   . Seizures (HCC)   . Urticaria     Patient Active Problem List   Diagnosis Date Noted  . Perennial and seasonal allergic rhinitis 01/27/2016  . Seasonal allergic conjunctivitis 01/27/2016  . Moderate persistent asthma 01/27/2016    History reviewed. No pertinent surgical history.      Home Medications    Prior to Admission medications   Medication Sig Start Date End Date Taking? Authorizing Provider  albuterol (PROVENTIL HFA;VENTOLIN HFA) 108 (90 Base) MCG/ACT inhaler Inhale 2 puffs into the lungs every 4 (four) hours as needed for wheezing or shortness of breath. 04/16/18   Elson Areas, PA-C  albuterol (VENTOLIN HFA) 108 (90 Base) MCG/ACT inhaler Inhale 2 puffs into the lungs every 4 (four) hours as needed for wheezing or shortness of breath. 04/13/19   Gilda Crease, MD  budesonide-formoterol (SYMBICORT) 160-4.5 MCG/ACT inhaler Inhale 2 puffs into the lungs 2 (two) times daily. 01/27/16   Bobbitt, Heywood Iles, MD  dexamethasone (DECADRON) 4 MG tablet Take 1 tablet (4 mg total) by mouth 2 (two) times daily with a meal. 05/12/16   Ivery Quale, PA-C  diclofenac (VOLTAREN) 75 MG EC tablet Take 1 tablet (75 mg total) by mouth 2 (two) times daily. 05/12/16   Ivery Quale, PA-C  EPINEPHrine 0.3 mg/0.3 mL IJ SOAJ injection Inject 0.3 mLs (0.3 mg total) into the muscle once. 02/08/16   Bobbitt, Heywood Iles, MD  lithium 600 MG capsule Take 600 mg by mouth 2 (two) times daily with a meal.    [provider]  methocarbamol (ROBAXIN) 500 MG tablet Take 1 tablet (500 mg total) by mouth 3 (three) times daily. 05/12/16   Ivery Quale, PA-C  montelukast (SINGULAIR) 10 MG tablet Take 1 tablet (10 mg total) by mouth at bedtime. 01/27/16   Bobbitt, Heywood Iles, MD  predniSONE (DELTASONE) 20 MG tablet Take 2 tablets (40 mg total) by mouth daily with breakfast. 04/13/19   Madaline Lefeber, Canary Brim, MD  topiramate (TOPAMAX) 100 MG tablet Take 100 mg by mouth at bedtime.    [provider]  topiramate (TOPAMAX) 25 MG tablet Take 25 mg by mouth every morning.    [provider]    Family History Family History  Problem Relation Age of Onset  . Asthma Mother   . Urticaria Mother   . Hypertension Mother   . Asthma Father   . Seizures Father   . Cancer Maternal Grandfather        prostate  . Cancer Paternal Grandmother        colon  . Cancer Paternal Grandfather        prostate  . Allergic  rhinitis Neg Hx   . Eczema Neg Hx   . Immunodeficiency Neg Hx     Social History Social History   Tobacco Use  . Smoking status: Former Smoker    Types: Cigars  . Smokeless tobacco: Former Neurosurgeon    Quit date: 03/13/2015  Substance Use Topics  . Alcohol use: Not Currently    Alcohol/week: 2.0 standard drinks    Types: 2 Cans of beer per week    Comment: weekends  . Drug use: No     Allergies   Shellfish allergy and Amoxicillin   Review of Systems Review of Systems  Respiratory: Positive for cough, shortness of breath and wheezing.   All other systems reviewed and are negative.    Physical Exam Updated Vital Signs BP 133/88   Pulse 96   Temp 98.7 F (37.1 C) (Oral)   Resp (!) 31   Ht  (1.88 m)   Wt 136.1 kg   SpO2 97%   BMI  38.52 kg/m   Physical Exam Vitals signs and nursing note reviewed.  Constitutional:      General: He is not in acute distress.    Appearance: Normal appearance. He is well-developed.  HENT:     Head: Normocephalic and atraumatic.     Right Ear: Hearing normal.     Left Ear: Hearing normal.     Nose: Nose normal.  Eyes:     Conjunctiva/sclera: Conjunctivae normal.     Pupils: Pupils are equal, round, and reactive to light.  Neck:     Musculoskeletal: Normal range of motion and neck supple.  Cardiovascular:     Rate and Rhythm: Regular rhythm.     Heart sounds: S1 normal and S2 normal. No murmur. No friction rub. No gallop.   Pulmonary:     Effort: Pulmonary effort is normal. No respiratory distress.     Breath sounds: Decreased breath sounds and wheezing present.  Chest:     Chest wall: No tenderness.  Abdominal:     General: Bowel sounds are normal.     Palpations: Abdomen is soft.     Tenderness: There is no abdominal tenderness. There is no guarding or rebound. Negative signs include Murphy's sign and McBurney's sign.     Hernia: No hernia is present.  Musculoskeletal: Normal range of motion.  Skin:    General: Skin is warm and dry.     Findings: No rash.  Neurological:     Mental Status: He is alert and oriented to person, place, and time.     GCS: GCS eye subscore is 4. GCS verbal subscore is 5. GCS motor subscore is 6.     Cranial Nerves: No cranial nerve deficit.     Sensory: No sensory deficit.     Coordination: Coordination normal.  Psychiatric:        Speech: Speech normal.        Behavior: Behavior normal.        Thought Content: Thought content normal.      ED Treatments / Results  Labs (all labs ordered are listed, but only abnormal results are displayed) Labs Reviewed - No data to display  EKG EKG Interpretation  Date/Time:  Saturday Apr 13 2019 22:43:37 EDT Ventricular Rate:  95 PR Interval:    QRS Duration: 96 QT Interval:  355 QTC  Calculation: 447 R Axis:   23 Text Interpretation:  Sinus rhythm Baseline wander in lead(s) II III aVF No previous tracing Confirmed by Krzysztof Reichelt,  Canary BrimChristopher J ([id number]) on 04/13/2019 11:04:19 PM   Radiology Dg Chest Port 1 View  Result Date: 04/13/2019 CLINICAL DATA:  Shortness of breath EXAM: PORTABLE CHEST 1 VIEW COMPARISON:  04/16/2018 FINDINGS: Heart and mediastinal contours are within normal limits. No focal opacities or effusions. No acute bony abnormality. IMPRESSION: No active disease. Electronically Signed   By: Charlett NoseKevin  Dover M.D.   On: 04/13/2019 23:31    Procedures Procedures (including critical care time)  Medications Ordered in ED Medications  albuterol (VENTOLIN HFA) 108 (90 Base) MCG/ACT inhaler 2 puff (2 puffs Inhalation Given 04/13/19 2324)  dexamethasone (DECADRON) injection 10 mg (10 mg Intramuscular Given 04/13/19 2341)     Initial Impression / Assessment and Plan / ED Course  I have reviewed the triage vital signs and the nursing notes.  Pertinent labs & imaging results that were available during my care of the patient were reviewed by me and considered in my medical decision making (see chart for details).       Patient presents to the emergency department for evaluation of asthma.  He has a long history of asthma, has been having worsening symptoms for the last week.  He ran out of his albuterol 2 days ago.  At arrival he is not in respiratory distress.  He has decreased air movement with wheezing, but oxygenation is normal.  Chest x-ray does not show evidence of pneumonia.  Will reinitiate albuterol, add steroid.   Gary Mcgee was evaluated in Emergency Department on 04/13/2019 for the symptoms described in the history of present illness. He was evaluated in the context of the global COVID-19 pandemic, which necessitated consideration that the patient might be at risk for infection with the SARS-CoV-2 virus that causes COVID-19. Institutional protocols and algorithms  that pertain to the evaluation of patients at risk for COVID-19 are in a state of rapid change based on information released by regulatory bodies including the CDC and federal and state organizations. These policies and algorithms were followed during the patient's care in the ED.    Final Clinical Impressions(s) / ED Diagnoses   Final diagnoses:  Moderate persistent asthma with exacerbation    ED Discharge Orders         Ordered    albuterol (VENTOLIN HFA) 108 (90 Base) MCG/ACT inhaler  Every 4 hours PRN     04/13/19 2343    predniSONE (DELTASONE) 20 MG tablet  Daily with breakfast     04/13/19 2343           Gilda CreasePollina, Lacora Folmer J, MD 04/13/19 2344

## 2019-04-13 NOTE — ED Triage Notes (Signed)
Patient reports increasing SOB x 1 week. States he has asthmas. Denies fever but states he has been diaphoretic today. Reports chest feeling "tight."

## 2019-11-28 HISTORY — PX: VASECTOMY: SHX75

## 2019-12-27 ENCOUNTER — Emergency Department (HOSPITAL_COMMUNITY): Admission: EM | Admit: 2019-12-27 | Discharge: 2019-12-27 | Discharge status: Absent without leave | Payer: Self-pay

## 2020-04-16 ENCOUNTER — Emergency Department (HOSPITAL_COMMUNITY): Payer: HRSA Program

## 2020-04-16 ENCOUNTER — Other Ambulatory Visit: Payer: Self-pay

## 2020-04-16 ENCOUNTER — Encounter (HOSPITAL_COMMUNITY): Payer: Self-pay | Admitting: Emergency Medicine

## 2020-04-16 ENCOUNTER — Emergency Department (HOSPITAL_COMMUNITY)
Admission: EM | Admit: 2020-04-16 | Discharge: 2020-04-17 | Disposition: A | Discharge status: Home / Self Care | Payer: HRSA Program | Attending: Emergency Medicine | Admitting: Emergency Medicine

## 2020-04-16 DIAGNOSIS — J4541 Moderate persistent asthma with (acute) exacerbation: Secondary | ICD-10-CM | POA: Insufficient documentation

## 2020-04-16 DIAGNOSIS — Z20822 Contact with and (suspected) exposure to covid-19: Secondary | ICD-10-CM | POA: Insufficient documentation

## 2020-04-16 DIAGNOSIS — R0789 Other chest pain: Secondary | ICD-10-CM | POA: Diagnosis not present

## 2020-04-16 DIAGNOSIS — U071 COVID-19: Secondary | ICD-10-CM | POA: Diagnosis not present

## 2020-04-16 DIAGNOSIS — R0602 Shortness of breath: Secondary | ICD-10-CM | POA: Diagnosis present

## 2020-04-16 LAB — CBC WITH DIFFERENTIAL/PLATELET
Abs Immature Granulocytes: 0.05 10*3/uL (ref 0.00–0.07)
Basophils Absolute: 0 10*3/uL (ref 0.0–0.1)
Basophils Relative: 1 %
Eosinophils Absolute: 0.3 10*3/uL (ref 0.0–0.5)
Eosinophils Relative: 5 %
HCT: 42.3 % (ref 39.0–52.0)
Hemoglobin: 13.8 g/dL (ref 13.0–17.0)
Immature Granulocytes: 1 %
Lymphocytes Relative: 28 %
Lymphs Abs: 1.6 10*3/uL (ref 0.7–4.0)
MCH: 29.7 pg (ref 26.0–34.0)
MCHC: 32.6 g/dL (ref 30.0–36.0)
MCV: 91.2 fL (ref 80.0–100.0)
Monocytes Absolute: 0.4 10*3/uL (ref 0.1–1.0)
Monocytes Relative: 7 %
Neutro Abs: 3.3 10*3/uL (ref 1.7–7.7)
Neutrophils Relative %: 58 %
Platelets: 255 10*3/uL (ref 150–400)
RBC: 4.64 MIL/uL (ref 4.22–5.81)
RDW: 12.6 % (ref 11.5–15.5)
WBC: 5.6 10*3/uL (ref 4.0–10.5)
nRBC: 0 % (ref 0.0–0.2)

## 2020-04-16 LAB — BASIC METABOLIC PANEL
Anion gap: 11 (ref 5–15)
BUN: 12 mg/dL (ref 6–20)
CO2: 24 mmol/L (ref 22–32)
Calcium: 9.1 mg/dL (ref 8.9–10.3)
Chloride: 101 mmol/L (ref 98–111)
Creatinine, Ser: 0.96 mg/dL (ref 0.61–1.24)
GFR calc Af Amer: >60 mL/min (ref >60)
GFR calc non Af Amer: >60 mL/min (ref >60)
Glucose, Bld: 100 mg/dL — ABNORMAL HIGH (ref 70–99)
Potassium: 3.5 mmol/L (ref 3.5–5.1)
Sodium: 136 mmol/L (ref 135–145)

## 2020-04-16 LAB — RESPIRATORY PANEL BY RT PCR (FLU A&B, COVID)
Influenza A by PCR: NEGATIVE
Influenza B by PCR: NEGATIVE
SARS Coronavirus 2 by RT PCR: POSITIVE — AB

## 2020-04-16 MED ORDER — ALBUTEROL SULFATE HFA 108 (90 BASE) MCG/ACT IN AERS
4.0000 | INHALATION_SPRAY | Freq: Once | RESPIRATORY_TRACT | Status: AC
  Administered 2020-04-16: 4 via RESPIRATORY_TRACT

## 2020-04-16 MED ORDER — METHYLPREDNISOLONE SODIUM SUCC 125 MG IJ SOLR
125.0000 mg | Freq: Once | INTRAMUSCULAR | Status: AC
  Administered 2020-04-16: 125 mg via INTRAVENOUS

## 2020-04-16 MED ORDER — METHYLPREDNISOLONE SODIUM SUCC 125 MG IJ SOLR
INTRAMUSCULAR | Status: AC
  Filled 2020-04-16: qty 2

## 2020-04-16 MED ORDER — ALBUTEROL SULFATE HFA 108 (90 BASE) MCG/ACT IN AERS
INHALATION_SPRAY | RESPIRATORY_TRACT | Status: AC
  Administered 2020-04-16: 4 via RESPIRATORY_TRACT
  Filled 2020-04-16: qty 6.7

## 2020-04-16 NOTE — ED Notes (Signed)
Patient ambulated in room on room air oxygen saturations were 90-91 percent. Patient's pulse rate was 116 while ambulating.

## 2020-04-16 NOTE — ED Provider Notes (Signed)
[facility] EMERGENCY DEPARTMENT Provider Note   CSN: [id number] Arrival date & time: 04/16/20  2028     History Chief Complaint  Patient presents with  . Shortness of Breath    COVID+ 5/6    Gary Mcgee is a 34 y.o. male with a history as outlined below, most significant for moderate persistent asthma presenting for an acute exacerbation of his asthma which started yesterday after mowing the lawn.  Today his breathing became persistently worse.  He describes wheezing, tightness in his chest and difficulty taking a deep breath.  He denies fevers or chills, no nausea vomiting or abdominal pain.  He does have seasonal allergies and is currently taking OTC Claritin.  He ran out of his albuterol MDI yesterday.  The history is provided by the patient.       Past Medical History:  Diagnosis Date  . Asthma   . Bipolar disorder (HCC)   . Seizures (HCC)   . Urticaria     Patient Active Problem List   Diagnosis Date Noted  . Perennial and seasonal allergic rhinitis 01/27/2016  . Seasonal allergic conjunctivitis 01/27/2016  . Moderate persistent asthma 01/27/2016    Past Surgical History:  Procedure Laterality Date  . VASECTOMY  11/28/2019       Family History  Problem Relation Age of Onset  . Asthma Mother   . Urticaria Mother   . Hypertension Mother   . Asthma Father   . Seizures Father   . Cancer Maternal Grandfather        prostate  . Cancer Paternal Grandmother        colon  . Cancer Paternal Grandfather        prostate  . Allergic rhinitis Neg Hx   . Eczema Neg Hx   . Immunodeficiency Neg Hx     Social History   Tobacco Use  . Smoking status: Former Smoker    Types: Cigars  . Smokeless tobacco: Former Neurosurgeon    Quit date: 03/13/2015  Substance Use Topics  . Alcohol use: Not Currently    Alcohol/week: 2.0 standard drinks    Types: 2 Cans of beer per week    Comment: weekends  . Drug use: No    Home Medications Prior to Admission medications     Medication Sig Start Date End Date Taking? Authorizing Provider  citalopram (CELEXA) 20 MG tablet Take 20 mg by mouth daily. 03/29/20  Yes [provider]  traZODone (DESYREL) 100 MG tablet Take 200 mg by mouth at bedtime. 02/17/20  Yes [provider]  albuterol (PROVENTIL HFA;VENTOLIN HFA) 108 (90 Base) MCG/ACT inhaler Inhale 2 puffs into the lungs every 4 (four) hours as needed for wheezing or shortness of breath. 04/16/18   Elson Areas, PA-C  predniSONE (DELTASONE) 50 MG tablet 1 tablet daily for 5 days 04/17/20   Burgess Amor, PA-C    Allergies    Shellfish allergy and Amoxicillin  Review of Systems   Review of Systems  Constitutional: Negative for fever.  HENT: Negative for congestion and sore throat.   Eyes: Negative.   Respiratory: Positive for cough, shortness of breath and wheezing. Negative for chest tightness.   Cardiovascular: Negative for chest pain.  Gastrointestinal: Negative for abdominal pain and nausea.  Genitourinary: Negative.   Musculoskeletal: Negative for arthralgias, joint swelling, myalgias and neck pain.  Skin: Negative.  Negative for rash and wound.  Neurological: Negative for dizziness, weakness, light-headedness, numbness and headaches.  Psychiatric/Behavioral: Negative.  Physical Exam Updated Vital Signs BP 120/73   Pulse (!) 102   Temp 98.2 F (36.8 C) (Oral)   Resp (!) 25   Ht 6\' 2"  (1.88 m)   Wt (!) 145.6 kg   SpO2 96%   BMI 41.21 kg/m   Physical Exam Vitals and nursing note reviewed.  Constitutional:      Appearance: He is well-developed.  HENT:     Head: Normocephalic and atraumatic.  Eyes:     Conjunctiva/sclera: Conjunctivae normal.  Cardiovascular:     Rate and Rhythm: Normal rate and regular rhythm.     Heart sounds: Normal heart sounds.  Pulmonary:     Effort: Tachypnea present. No accessory muscle usage.     Breath sounds: Decreased breath sounds and wheezing present. No rhonchi or rales.     Comments:  Decreased breath sounds throughout all lung fields with prolonged expirations. Abdominal:     General: Bowel sounds are normal.     Palpations: Abdomen is soft.     Tenderness: There is no abdominal tenderness.  Musculoskeletal:        General: Normal range of motion.     Cervical back: Normal range of motion.  Skin:    General: Skin is warm and dry.  Neurological:     Mental Status: He is alert.     ED Results / Procedures / Treatments   Labs (all labs ordered are listed, but only abnormal results are displayed) Labs Reviewed  RESPIRATORY PANEL BY RT PCR (FLU A&B, COVID) - Abnormal; Notable for the following components:      Result Value   SARS Coronavirus 2 by RT PCR POSITIVE (*)    All other components within normal limits  BASIC METABOLIC PANEL - Abnormal; Notable for the following components:   Glucose, Bld 100 (*)    All other components within normal limits  CBC WITH DIFFERENTIAL/PLATELET    EKG EKG Interpretation  Date/Time:  Thursday Apr 16 2020 20:40:58 EDT Ventricular Rate:  93 PR Interval:    QRS Duration: 73 QT Interval:  358 QTC Calculation: 446 R Axis:   152 Text Interpretation: Right and left arm electrode reversal, interpretation assumes no reversal Sinus rhythm Borderline short PR interval Right axis deviation Nonspecific T abnormalities, lateral leads Confirmed by Fredia Sorrow [phone]) on 04/16/2020 8:53:06 PM   Radiology DG Chest Port 1 View  Result Date: 04/16/2020 CLINICAL DATA:  34 year old male with shortness of breath. EXAM: PORTABLE CHEST 1 VIEW COMPARISON:  Chest radiograph dated 04/13/2019. FINDINGS: There is no focal consolidation, pleural effusion or pneumothorax. The cardiac silhouette is within limits. No acute osseous pathology. IMPRESSION: No active disease. Electronically Signed   By: Anner Crete M.D.   On: 04/16/2020 21:31    Procedures Procedures (including critical care time)  Medications Ordered in ED Medications    albuterol (VENTOLIN HFA) 108 (90 Base) MCG/ACT inhaler (4 puffs Inhalation Given 04/16/20 2122)  albuterol (VENTOLIN HFA) 108 (90 Base) MCG/ACT inhaler 4 puff (4 puffs Inhalation Given 04/16/20 2053)  methylPREDNISolone sodium succinate (SOLU-MEDROL) 125 mg/2 mL injection 125 mg (125 mg Intravenous Given 04/16/20 2054)    ED Course  I have reviewed the triage vital signs and the nursing notes.  Pertinent labs & imaging results that were available during my care of the patient were reviewed by me and considered in my medical decision making (see chart for details).    MDM Rules/Calculators/A&P  Patient clinically has an acute exacerbation of his asthma associated with yard work since yesterday.  He surprisingly tested positive for Covid.  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  His wheezing completely resolved after receiving Solu-Medrol and multiple doses of albuterol inhaler.  He had run out of his inhaler yesterday, so had no treatment prior to arrival.  He was ambulated in his room and his oxygen saturation was maintained in the low 90-92 range.  He denies any shortness of breath or symptoms, no work of breathing.  We discussed various options including admission which he is hesitant given his response to treatment here.  His chest x-ray is clear without  evidence for Covid pneumonitis.  He will be discharged home with pulse dosing prednisone and albuterol MDI.  Strict return precautions were given regarding recheck here for any worsening symptoms, specifically shortness of breath.  Discussed need for home quarantine for 10 days per [facility] recommendations.  Work note was given.  We also discussed the [facility] monoclonal antibody infusion clinic and he was given information about this clinic.  I also left a message on the clinics voicemail regarding contacting this patient.   He is agreeable to participate if he is deemed a candidate.  I suspect he will given his asthma and obesity risk factors.  He was symptom-free and his breathing was stable at time of discharge.  Gary Mcgee was evaluated in Emergency Department on 04/17/2020 for the symptoms described in the history of present illness. He was evaluated in the context of the global COVID-19 pandemic, which necessitated consideration that the patient might be at risk for infection with the SARS-CoV-2 virus that causes COVID-19. Institutional protocols and algorithms that pertain to the evaluation of patients at risk for COVID-19 are in a state of rapid change based on information released by regulatory bodies including the CDC and federal and state organizations. These policies and algorithms were followed during the patient's care in the ED.  Final Clinical Impression(s) / ED Diagnoses Final diagnoses:  Moderate persistent asthma with exacerbation  COVID-19    Rx / DC Orders ED Discharge Orders         Ordered    predniSONE (DELTASONE) 50 MG tablet     04/17/20 0022           Burgess Amor, PA-C 04/17/20 0038    Vanetta Mulders, MD 04/22/20 [phone]

## 2020-04-16 NOTE — ED Notes (Signed)
Respiratory called and informed of order for peak flows.

## 2020-04-16 NOTE — ED Notes (Signed)
Pt states he feels a little better.

## 2020-04-16 NOTE — ED Triage Notes (Signed)
Patient c/o shortness of breath than started this morning.Patient has a history of asthma but is out of rescue inhalers. Patient states he has no known covid exposure. Patient is also c/o muscle cramping.

## 2020-04-16 NOTE — ED Notes (Signed)
X-ray at bedside

## 2020-04-16 NOTE — Progress Notes (Signed)
Best peak flow measurement out of 3 attempts was 300 l/min. Patient had good effort due to his nasal congestion.

## 2020-04-17 ENCOUNTER — Telehealth: Payer: Self-pay | Admitting: Nurse Practitioner

## 2020-04-17 ENCOUNTER — Telehealth: Payer: Self-pay | Admitting: Unknown Physician Specialty

## 2020-04-17 MED ORDER — PREDNISONE 50 MG PO TABS: ORAL_TABLET | ORAL | 0 refills | Status: DC

## 2020-04-17 NOTE — Discharge Instructions (Signed)
As discussed you will need to maintain home isolation for 10 days given you are Covid positive today.  Continue to use your albuterol inhaler if needed, 2 puffs every 4 hours for wheezing or shortness of breath.  Take your next dose of prednisone tomorrow evening.  You should be called by someone from [facility] monoclonal antibody infusion clinic, probably tomorrow to discuss this treatment which can help minimize the risk of your Covid infection becoming severe.  Be expecting this phone call.  In the interim make sure you are drinking plenty of fluids.  Return here for any worsening symptoms.

## 2020-04-17 NOTE — Telephone Encounter (Signed)
Called to discuss with patient about Covid symptoms and the use of bamlanivimab, a monoclonal antibody infusion for those with mild to moderate Covid symptoms and at a high risk of hospitalization.  Pt is qualified for this infusion at the [facility] infusion center due to BMI>35   Message left to call back  

## 2020-04-17 NOTE — Telephone Encounter (Signed)
Called to discuss with [name] about Covid symptoms and the use of bamlanivimab, a monoclonal antibody infusion for those with mild to moderate Covid symptoms and at a high risk of hospitalization.     Pt is qualified for this infusion at the [facility] infusion center due to co-morbid conditions (BMI >35) and/or a member of an at-risk group. Per ED notes symptoms started on 5/5. Further discussions needed to determine true onset.    Patient Active Problem List   Diagnosis Date Noted  . Perennial and seasonal allergic rhinitis 01/27/2016  . Seasonal allergic conjunctivitis 01/27/2016  . Moderate persistent asthma 01/27/2016    Willette Alma, AGPCNP-BC Pager: [phone] Amion: Thea Alken

## 2020-04-23 ENCOUNTER — Other Ambulatory Visit: Payer: Self-pay

## 2020-04-23 ENCOUNTER — Emergency Department (HOSPITAL_COMMUNITY)
Admission: EM | Admit: 2020-04-23 | Discharge: 2020-04-24 | Disposition: A | Discharge status: Home / Self Care | Payer: HRSA Program | Attending: Emergency Medicine | Admitting: Emergency Medicine

## 2020-04-23 ENCOUNTER — Emergency Department (HOSPITAL_COMMUNITY): Payer: HRSA Program

## 2020-04-23 ENCOUNTER — Encounter (HOSPITAL_COMMUNITY): Payer: Self-pay

## 2020-04-23 DIAGNOSIS — J45909 Unspecified asthma, uncomplicated: Secondary | ICD-10-CM | POA: Insufficient documentation

## 2020-04-23 DIAGNOSIS — R0602 Shortness of breath: Secondary | ICD-10-CM | POA: Diagnosis present

## 2020-04-23 DIAGNOSIS — U071 COVID-19: Secondary | ICD-10-CM | POA: Insufficient documentation

## 2020-04-23 DIAGNOSIS — Z87891 Personal history of nicotine dependence: Secondary | ICD-10-CM | POA: Diagnosis not present

## 2020-04-23 NOTE — ED Triage Notes (Signed)
Pt diagnosed with Covid on May 6th, states he has had worsening sob and body aches, taking ibuprofen for same. Pt nad

## 2020-04-23 NOTE — Discharge Instructions (Addendum)
You were evaluated in the Emergency Department and after careful evaluation, we did not find any emergent condition requiring admission or further testing in the hospital.  Your exam/testing today was overall reassuring.  As discussed, we recommend 1000 mg Tylenol every 4-6 hours as well as 600 mg Motrin every 4-6 hours to help control your body soreness.  We think this will improve your breathing as well.  Please return to the Emergency Department if you experience any worsening of your condition.  We encourage you to follow up with a primary care provider.  Thank you for allowing [place] to be a part of your care.

## 2020-04-23 NOTE — ED Provider Notes (Signed)
[facility]-EMERGENCY DEPT [facility] Emergency Department Provider Note MRN:  [id number]  Arrival date & time: 04/23/20     Chief Complaint   Shortness of Breath (Covid +)   History of Present Illness   Gary Mcgee is a 34 y.o. year-old male with a history of asthma, bipolar disorder presenting to the ED with chief complaint of shortness of breath.  Patient tested positive for Covid and began having mild symptoms on May 6.  Cough, body aches.  Shortness of breath worsening throughout the day today, here for repeat evaluation.  Was put on prednisone on the sixth, completed that course yesterday.  Denies fever, no chest pain, no leg pain or swelling, no abdominal pain, no vomiting or diarrhea.  Review of Systems  A complete 10 system review of systems was obtained and all systems are negative except as noted in the HPI and PMH.   Patient's Health History    Past Medical History:  Diagnosis Date  . Asthma   . Bipolar disorder (HCC)   . Seizures (HCC)   . Urticaria     Past Surgical History:  Procedure Laterality Date  . VASECTOMY  11/28/2019    Family History  Problem Relation Age of Onset  . Asthma Mother   . Urticaria Mother   . Hypertension Mother   . Asthma Father   . Seizures Father   . Cancer Maternal Grandfather        prostate  . Cancer Paternal Grandmother        colon  . Cancer Paternal Grandfather        prostate  . Allergic rhinitis Neg Hx   . Eczema Neg Hx   . Immunodeficiency Neg Hx     Social History   Socioeconomic History  . Marital status: Married    Spouse name: Not on file  . Number of children: Not on file  . Years of education: Not on file  . Highest education level: Not on file  Occupational History  . Not on file  Tobacco Use  . Smoking status: Former Smoker    Types: Cigars  . Smokeless tobacco: Former Neurosurgeon    Quit date: 03/13/2015  Substance and Sexual Activity  . Alcohol use: Not Currently    Alcohol/week: 2.0 standard  drinks    Types: 2 Cans of beer per week    Comment: weekends  . Drug use: No  . Sexual activity: Yes  Other Topics Concern  . Not on file  Social History Narrative  . Not on file   Social Determinants of Health   Financial Resource Strain:   . Difficulty of Paying Living Expenses:   Food Insecurity:   . Worried About Programme researcher, broadcasting/film/video in the Last Year:   . Barista in the Last Year:   Transportation Needs:   . Freight forwarder (Medical):   Marland Kitchen Lack of Transportation (Non-Medical):   Physical Activity:   . Days of Exercise per Week:   . Minutes of Exercise per Session:   Stress:   . Feeling of Stress :   Social Connections:   . Frequency of Communication with Friends and Family:   . Frequency of Social Gatherings with Friends and Family:   . Attends Religious Services:   . Active Member of Clubs or Organizations:   . Attends Banker Meetings:   Marland Kitchen Marital Status:   Intimate Partner Violence:   . Fear of Current or Ex-Partner:   .  Emotionally Abused:   Marland Kitchen Physically Abused:   . Sexually Abused:      Physical Exam   Vitals:   04/23/20 2235  BP: 140/89  Pulse: 90  Resp: (!) 22  Temp: 99.9 F (37.7 C)  SpO2: 96%    CONSTITUTIONAL: Well-appearing, NAD NEURO:  Alert and oriented x 3, no focal deficits EYES:  eyes equal and reactive ENT/NECK:  no LAD, no JVD CARDIO: Regular rate, well-perfused, normal S1 and S2 PULM:  CTAB no wheezing or rhonchi, no increased work of breathing GI/GU:  normal bowel sounds, non-distended, non-tender MSK/SPINE:  No gross deformities, no edema SKIN:  no rash, atraumatic PSYCH:  Appropriate speech and behavior  *Additional and/or pertinent findings included in MDM below  Diagnostic and Interventional Summary    EKG Interpretation  Date/Time:  Thursday Apr 23 2020 23:10:33 EDT Ventricular Rate:  91 PR Interval:    QRS Duration: 94 QT Interval:  363 QTC Calculation: 447 R Axis:   8 Text  Interpretation: Sinus rhythm Low voltage, precordial leads Confirmed by Gerlene Fee [phone]) on 04/23/2020 11:35:04 PM      Labs Reviewed - No data to display  DG Chest Port 1 View  Final Result      Medications - No data to display   Procedures  /  Critical Care Procedures  ED Course and Medical Decision Making  I have reviewed the triage vital signs, the nursing notes, and pertinent available records from the EMR.  Listed above are laboratory and imaging tests that I personally ordered, reviewed, and interpreted and then considered in my medical decision making (see below for details).  Worsening shortness of breath in the setting of COVID-19, however very reassuring exam and vital signs, no hypoxia, anticipating discharge with reassurance and strict return precautions.  EKG and chest x-ray are normal, no evidence of atypical infection or pneumonia.  EKG without ischemic features.  I suspect that patient's symptoms are related to body soreness keeping him from taking deep breaths.  No evidence of DVT, doubt pulmonary embolism.  Appropriate for discharge with strict return precautions.  Gary Mcgee was evaluated in Emergency Department on 04/23/2020 for the symptoms described in the history of present illness. He was evaluated in the context of the global COVID-19 pandemic, which necessitated consideration that the patient might be at risk for infection with the SARS-CoV-2 virus that causes COVID-19. Institutional protocols and algorithms that pertain to the evaluation of patients at risk for COVID-19 are in a state of rapid change based on information released by regulatory bodies including the CDC and federal and state organizations. These policies and algorithms were followed during the patient's care in the ED.  Barth Kirks. Sedonia Small, [place] [email or web address]  Final Clinical Impressions(s) / ED Diagnoses     ICD-10-CM   1.  COVID-19  U07.1   2. Shortness of breath  R06.02     ED Discharge Orders    None       Discharge Instructions Discussed with and Provided to Patient:     Discharge Instructions     You were evaluated in the Emergency Department and after careful evaluation, we did not find any emergent condition requiring admission or further testing in the hospital.  Your exam/testing today was overall reassuring.  As discussed, we recommend 1000 mg Tylenol every 4-6 hours as well as 600 mg Motrin every 4-6 hours to help control your body soreness.  We think this  will improve your breathing as well.  Please return to the Emergency Department if you experience any worsening of your condition.  We encourage you to follow up with a primary care provider.  Thank you for allowing [place] to be a part of your care.        Sabas Sous, MD 04/23/20 2340

## 2020-04-24 NOTE — ED Notes (Signed)
Pt ambulatory to waiting room. Pt verbalized understanding of discharge instructions.   

## 2021-04-25 ENCOUNTER — Emergency Department (HOSPITAL_COMMUNITY)
Admission: EM | Admit: 2021-04-25 | Discharge: 2021-04-25 | Disposition: A | Discharge status: Home / Self Care | Payer: Self-pay | Attending: Emergency Medicine | Admitting: Emergency Medicine

## 2021-04-25 ENCOUNTER — Encounter (HOSPITAL_COMMUNITY): Payer: Self-pay | Admitting: *Deleted

## 2021-04-25 ENCOUNTER — Emergency Department (HOSPITAL_COMMUNITY): Payer: Self-pay

## 2021-04-25 ENCOUNTER — Other Ambulatory Visit: Payer: Self-pay

## 2021-04-25 DIAGNOSIS — Z20822 Contact with and (suspected) exposure to covid-19: Secondary | ICD-10-CM | POA: Insufficient documentation

## 2021-04-25 DIAGNOSIS — J45901 Unspecified asthma with (acute) exacerbation: Secondary | ICD-10-CM | POA: Insufficient documentation

## 2021-04-25 DIAGNOSIS — J4521 Mild intermittent asthma with (acute) exacerbation: Secondary | ICD-10-CM

## 2021-04-25 DIAGNOSIS — Z87891 Personal history of nicotine dependence: Secondary | ICD-10-CM | POA: Insufficient documentation

## 2021-04-25 LAB — CBC WITH DIFFERENTIAL/PLATELET
Abs Immature Granulocytes: 0.01 10*3/uL (ref 0.00–0.07)
Basophils Absolute: 0 10*3/uL (ref 0.0–0.1)
Basophils Relative: 0 %
Eosinophils Absolute: 0.3 10*3/uL (ref 0.0–0.5)
Eosinophils Relative: 7 %
HCT: 45.9 % (ref 39.0–52.0)
Hemoglobin: 14.8 g/dL (ref 13.0–17.0)
Immature Granulocytes: 0 %
Lymphocytes Relative: 32 %
Lymphs Abs: 1.5 10*3/uL (ref 0.7–4.0)
MCH: 29.1 pg (ref 26.0–34.0)
MCHC: 32.2 g/dL (ref 30.0–36.0)
MCV: 90.2 fL (ref 80.0–100.0)
Monocytes Absolute: 0.4 10*3/uL (ref 0.1–1.0)
Monocytes Relative: 9 %
Neutro Abs: 2.4 10*3/uL (ref 1.7–7.7)
Neutrophils Relative %: 52 %
Platelets: 211 10*3/uL (ref 150–400)
RBC: 5.09 MIL/uL (ref 4.22–5.81)
RDW: 13.2 % (ref 11.5–15.5)
WBC: 4.6 10*3/uL (ref 4.0–10.5)
nRBC: 0 % (ref 0.0–0.2)

## 2021-04-25 LAB — COMPREHENSIVE METABOLIC PANEL
ALT: 70 U/L — ABNORMAL HIGH (ref 0–44)
AST: 76 U/L — ABNORMAL HIGH (ref 15–41)
Albumin: 4.1 g/dL (ref 3.5–5.0)
Alkaline Phosphatase: 65 U/L (ref 38–126)
Anion gap: 7 (ref 5–15)
BUN: 10 mg/dL (ref 6–20)
CO2: 28 mmol/L (ref 22–32)
Calcium: 8.9 mg/dL (ref 8.9–10.3)
Chloride: 105 mmol/L (ref 98–111)
Creatinine, Ser: 0.94 mg/dL (ref 0.61–1.24)
GFR, Estimated: >60 mL/min (ref >60)
Glucose, Bld: 100 mg/dL — ABNORMAL HIGH (ref 70–99)
Potassium: 3.7 mmol/L (ref 3.5–5.1)
Sodium: 140 mmol/L (ref 135–145)
Total Bilirubin: 0.7 mg/dL (ref 0.3–1.2)
Total Protein: 7 g/dL (ref 6.5–8.1)

## 2021-04-25 LAB — POC SARS CORONAVIRUS 2 AG -  ED: SARSCOV2ONAVIRUS 2 AG: NEGATIVE

## 2021-04-25 MED ORDER — IPRATROPIUM-ALBUTEROL 0.5-2.5 (3) MG/3ML IN SOLN
3.0000 mL | Freq: Once | RESPIRATORY_TRACT | Status: AC
  Administered 2021-04-25: 3 mL via RESPIRATORY_TRACT
  Filled 2021-04-25: qty 3

## 2021-04-25 MED ORDER — METHYLPREDNISOLONE SODIUM SUCC 125 MG IJ SOLR
125.0000 mg | Freq: Once | INTRAMUSCULAR | Status: AC
  Administered 2021-04-25: 125 mg via INTRAVENOUS
  Filled 2021-04-25: qty 2

## 2021-04-25 MED ORDER — PREDNISONE 10 MG PO TABS: 20.0000 mg | ORAL_TABLET | Freq: Every day | ORAL | 0 refills | Status: DC

## 2021-04-25 MED ORDER — DOXYCYCLINE HYCLATE 100 MG PO CAPS: 100.0000 mg | ORAL_CAPSULE | Freq: Two times a day (BID) | ORAL | 0 refills | Status: AC

## 2021-04-25 MED ORDER — ALBUTEROL SULFATE HFA 108 (90 BASE) MCG/ACT IN AERS: 1.0000 | INHALATION_SPRAY | Freq: Four times a day (QID) | RESPIRATORY_TRACT | 1 refills | Status: AC | PRN

## 2021-04-25 MED ORDER — MAGNESIUM SULFATE 2 GM/50ML IV SOLN
2.0000 g | Freq: Once | INTRAVENOUS | Status: AC
  Administered 2021-04-25: 2 g via INTRAVENOUS
  Filled 2021-04-25: qty 50

## 2021-04-25 MED ORDER — ALBUTEROL SULFATE (2.5 MG/3ML) 0.083% IN NEBU
2.5000 mg | INHALATION_SOLUTION | Freq: Once | RESPIRATORY_TRACT | Status: AC
  Administered 2021-04-25: 2.5 mg via RESPIRATORY_TRACT
  Filled 2021-04-25: qty 3

## 2021-04-25 NOTE — ED Provider Notes (Signed)
[facility] EMERGENCY DEPARTMENT Provider Note   CSN: [id number] Arrival date & time: 04/25/21  1319     History Chief Complaint  Patient presents with  . Shortness of Breath    Asthma attack     Gary Mcgee is a 35 y.o. male.  Patient has a history of asthma.  Patient complains of shortness of breath and wheezing.  He has run out of his inhaler  The history is provided by the patient and medical records. No language interpreter was used.  Shortness of Breath Severity:  Moderate Onset quality:  Sudden Timing:  Constant Progression:  Worsening Chronicity:  Recurrent Context: activity   Relieved by:  Nothing Worsened by:  Nothing Ineffective treatments:  None tried Associated symptoms: wheezing   Associated symptoms: no abdominal pain, no chest pain, no cough, no headaches and no rash        Past Medical History:  Diagnosis Date  . Asthma   . Bipolar disorder (HCC)   . Seizures (HCC)   . Urticaria     Patient Active Problem List   Diagnosis Date Noted  . Perennial and seasonal allergic rhinitis 01/27/2016  . Seasonal allergic conjunctivitis 01/27/2016  . Moderate persistent asthma 01/27/2016    Past Surgical History:  Procedure Laterality Date  . VASECTOMY  11/28/2019       Family History  Problem Relation Age of Onset  . Asthma Mother   . Urticaria Mother   . Hypertension Mother   . Asthma Father   . Seizures Father   . Cancer Maternal Grandfather        prostate  . Cancer Paternal Grandmother        colon  . Cancer Paternal Grandfather        prostate  . Allergic rhinitis Neg Hx   . Eczema Neg Hx   . Immunodeficiency Neg Hx     Social History   Tobacco Use  . Smoking status: Former Smoker    Types: Cigars  . Smokeless tobacco: Former Neurosurgeon    Quit date: 03/13/2015  Vaping Use  . Vaping Use: Former  . Quit date: 03/13/2019  Substance Use Topics  . Alcohol use: Not Currently    Alcohol/week: 2.0 standard drinks    Types: 2 Cans  of beer per week    Comment: weekends  . Drug use: No    Home Medications Prior to Admission medications   Medication Sig Start Date End Date Taking? Authorizing Provider  albuterol (PROVENTIL HFA;VENTOLIN HFA) 108 (90 Base) MCG/ACT inhaler Inhale 2 puffs into the lungs every 4 (four) hours as needed for wheezing or shortness of breath. 04/16/18   Elson Areas, PA-C  citalopram (CELEXA) 20 MG tablet Take 20 mg by mouth daily. 03/29/20   [provider]  predniSONE (DELTASONE) 50 MG tablet 1 tablet daily for 5 days 04/17/20   Burgess Amor, PA-C  traZODone (DESYREL) 100 MG tablet Take 200 mg by mouth at bedtime. 02/17/20   [provider]    Allergies    Shellfish allergy and Amoxicillin  Review of Systems   Review of Systems  Constitutional: Negative for appetite change and fatigue.  HENT: Negative for congestion, ear discharge and sinus pressure.   Eyes: Negative for discharge.  Respiratory: Positive for shortness of breath and wheezing. Negative for cough.   Cardiovascular: Negative for chest pain.  Gastrointestinal: Negative for abdominal pain and diarrhea.  Genitourinary: Negative for frequency and hematuria.  Musculoskeletal: Negative for back pain.  Skin: Negative for rash.  Neurological: Negative for seizures and headaches.  Psychiatric/Behavioral: Negative for hallucinations.    Physical Exam Updated Vital Signs BP (!) 143/88   Pulse 89   Temp 99.3 F (37.4 C) (Oral)   Resp 19   Ht 6\' 2"  (1.88 m)   Wt (!) 149.7 kg   SpO2 93%   BMI 42.37 kg/m   Physical Exam Vitals and nursing note reviewed.  Constitutional:      Appearance: He is well-developed.  HENT:     Head: Normocephalic.     Nose: Nose normal.  Eyes:     General: No scleral icterus.    Conjunctiva/sclera: Conjunctivae normal.  Neck:     Thyroid: No thyromegaly.  Cardiovascular:     Rate and Rhythm: Normal rate and regular rhythm.     Heart sounds: No murmur heard. No friction rub.  No gallop.   Pulmonary:     Breath sounds: No stridor. Wheezing present. No rales.  Chest:     Chest wall: No tenderness.  Abdominal:     General: There is no distension.     Tenderness: There is no abdominal tenderness. There is no rebound.  Musculoskeletal:        General: Normal range of motion.     Cervical back: Neck supple.  Lymphadenopathy:     Cervical: No cervical adenopathy.  Skin:    Findings: No erythema or rash.  Neurological:     Mental Status: He is alert and oriented to person, place, and time.     Motor: No abnormal muscle tone.     Coordination: Coordination normal.  Psychiatric:        Behavior: Behavior normal.     ED Results / Procedures / Treatments   Labs (all labs ordered are listed, but only abnormal results are displayed) Labs Reviewed  COMPREHENSIVE METABOLIC PANEL - Abnormal; Notable for the following components:      Result Value   Glucose, Bld 100 (*)    AST 76 (*)    ALT 70 (*)    All other components within normal limits  CBC WITH DIFFERENTIAL/PLATELET  POC SARS CORONAVIRUS 2 AG -  ED    EKG None  Radiology DG Chest Port 1 View  Result Date: 04/25/2021 CLINICAL DATA:  Acute onset of shortness of breath and chest tightness that began yesterday after mowing his yard. EXAM: PORTABLE CHEST 1 VIEW COMPARISON:  04/23/2020 and earlier. FINDINGS: Cardiomediastinal silhouette unremarkable and unchanged. Lungs clear. Bronchovascular markings normal. Pulmonary vascularity normal. No visible pleural effusions. No pneumothorax. IMPRESSION: No acute cardiopulmonary disease. Electronically Signed   By: 04/25/2020 M.D.   On: 04/25/2021 14:33    Procedures Procedures   Medications Ordered in ED Medications  ipratropium-albuterol (DUONEB) 0.5-2.5 (3) MG/3ML nebulizer solution 3 mL (has no administration in time range)  albuterol (PROVENTIL) (2.5 MG/3ML) 0.083% nebulizer solution 2.5 mg (has no administration in time range)   ipratropium-albuterol (DUONEB) 0.5-2.5 (3) MG/3ML nebulizer solution 3 mL (3 mLs Nebulization Given 04/25/21 1351)  albuterol (PROVENTIL) (2.5 MG/3ML) 0.083% nebulizer solution 2.5 mg (2.5 mg Nebulization Given 04/25/21 1351)  magnesium sulfate IVPB 2 g 50 mL (0 g Intravenous Stopped 04/25/21 1438)  methylPREDNISolone sodium succinate (SOLU-MEDROL) 125 mg/2 mL injection 125 mg (125 mg Intravenous Given 04/25/21 1345)    ED Course  I have reviewed the triage vital signs and the nursing notes.  Pertinent labs & imaging results that were available during my care of the patient  were reviewed by me and considered in my medical decision making (see chart for details).    CRITICAL CARE Performed by: Bethann Berkshire Total critical care time: Critical care time was exclusive of separately billable procedures and treating other patients. Critical care was necessary to treat or prevent imminent or life-threatening deterioration. Critical care was time spent personally by me on the following activities: development of treatment plan with patient and/or surrogate as well as nursing, discussions with consultants, evaluation of patient's response to treatment, examination of patient, obtaining history from patient or surrogate, ordering and performing treatments and interventions, ordering and review of laboratory studies, ordering and review of radiographic studies, pulse oximetry and re-evaluation of patient's condition.  MDM Rules/Calculators/A&P                          Patient with asthma exacerbation.  He will be sent home with albuterol inhaler and prednisone and will follow up this week Final Clinical Impression(s) / ED Diagnoses Final diagnoses:  None    Rx / DC Orders ED Discharge Orders    None       Bethann Berkshire, MD 04/28/21 1032

## 2021-04-25 NOTE — ED Triage Notes (Signed)
Pt c/o sob and tightness in chest that started yesterday after mowing the grass; pt unable to find inhaler

## 2021-04-25 NOTE — Discharge Instructions (Addendum)
Follow-up with your family doctor next week for recheck. 

## 2021-10-06 ENCOUNTER — Encounter (HOSPITAL_COMMUNITY): Payer: Self-pay

## 2021-10-06 ENCOUNTER — Other Ambulatory Visit: Payer: Self-pay

## 2021-10-06 ENCOUNTER — Emergency Department (HOSPITAL_COMMUNITY)
Admission: EM | Admit: 2021-10-06 | Discharge: 2021-10-06 | Disposition: A | Discharge status: Home / Self Care | Payer: Medicaid Other | Attending: Emergency Medicine | Admitting: Emergency Medicine

## 2021-10-06 ENCOUNTER — Emergency Department (HOSPITAL_COMMUNITY): Payer: Medicaid Other

## 2021-10-06 DIAGNOSIS — M722 Plantar fascial fibromatosis: Secondary | ICD-10-CM | POA: Insufficient documentation

## 2021-10-06 MED ORDER — NAPROXEN 500 MG PO TABS: 500.0000 mg | ORAL_TABLET | Freq: Two times a day (BID) | ORAL | 0 refills | Status: AC

## 2021-10-06 NOTE — ED Triage Notes (Signed)
Pt c/o pain in bottom of r foot x 1 month. Denies any injury but says felt something pull in the bottom of his r foot yesterday.

## 2021-10-06 NOTE — Discharge Instructions (Signed)
You were seen and evaluated in the emergency department for further evaluation of left foot pain.  As we discussed, your imaging was negative for any fractures or dislocations.  This is likely plantar fasciitis.  Please read the handouts for appropriate rehab.  Tylenol/ibuprofen for pain.  You may need to get some heel/arch support.  I would like for you to follow-up with your primary care doctor within the next week for possible orthopedic referral.  Please return to the emergency department if you experience worsening pain, severe swelling, numbness/weakness to the foot, or any other concerns you might have.

## 2021-10-06 NOTE — ED Provider Notes (Signed)
[facility] EMERGENCY DEPARTMENT Provider Note   CSN: [id number] Arrival date & time: 10/06/21  6440     History Chief Complaint  Patient presents with   Foot Pain    Gary Mcgee is a 35 y.o. male who presents the emergency department for 1 month history of right foot pain.  He has been complaining of constant right foot pain localized to the plantar aspect of the right foot.  He was playing with his kids yesterday and felt a pop sensation in that foot followed by severe pain which prompted his arrival to the ED today.  He denies any specific injury or trauma to the foot.  States it is worse in the morning when he first gets out of bed and gets progressively better as he walks out throughout the day.  He also states it is worse when he extends the toes.  He denies any fever, chills, leg pain, leg swelling, trouble with his toenails.  The history is provided by the patient. No language interpreter was used.  Foot Pain      Past Medical History:  Diagnosis Date   Asthma    Bipolar disorder (HCC)    Seizures (HCC)    Urticaria     Patient Active Problem List   Diagnosis Date Noted   Perennial and seasonal allergic rhinitis 01/27/2016   Seasonal allergic conjunctivitis 01/27/2016   Moderate persistent asthma 01/27/2016    Past Surgical History:  Procedure Laterality Date   VASECTOMY  11/28/2019       Family History  Problem Relation Age of Onset   Asthma Mother    Urticaria Mother    Hypertension Mother    Asthma Father    Seizures Father    Cancer Maternal Grandfather        prostate   Cancer Paternal Grandmother        colon   Cancer Paternal Grandfather        prostate   Allergic rhinitis Neg Hx    Eczema Neg Hx    Immunodeficiency Neg Hx     Social History   Tobacco Use   Smoking status: Former    Types: Cigars   Smokeless tobacco: Former    Quit date: 03/13/2015  Vaping Use   Vaping Use: Former   Quit date: 03/13/2019  Substance Use Topics    Alcohol use: Not Currently    Alcohol/week: 2.0 standard drinks    Types: 2 Cans of beer per week    Comment: weekends   Drug use: No    Home Medications Prior to Admission medications   Medication Sig Start Date End Date Taking? Authorizing Provider  naproxen (NAPROSYN) 500 MG tablet Take 1 tablet (500 mg total) by mouth 2 (two) times daily. 10/06/21  Yes Meredeth Ide, Elvie Maines M, PA-C  albuterol (VENTOLIN HFA) 108 (90 Base) MCG/ACT inhaler Inhale 1-2 puffs into the lungs every 6 (six) hours as needed for wheezing or shortness of breath. 04/25/21   Bethann Berkshire, MD  citalopram (CELEXA) 20 MG tablet Take 20 mg by mouth daily. 03/29/20   [provider]  doxycycline (VIBRAMYCIN) 100 MG capsule Take 1 capsule (100 mg total) by mouth 2 (two) times daily. One po bid x 7 days 04/25/21   Bethann Berkshire, MD  predniSONE (DELTASONE) 10 MG tablet Take 2 tablets (20 mg total) by mouth daily. 04/25/21   Bethann Berkshire, MD  traZODone (DESYREL) 100 MG tablet Take 200 mg by mouth at bedtime. 02/17/20  [provider]    Allergies    Shellfish allergy and Amoxicillin  Review of Systems   Review of Systems  All other systems reviewed and are negative.  Physical Exam Updated Vital Signs BP (!) 150/89 (BP Location: Left Arm)   Pulse 88   Temp 98.3 F (36.8 C) (Oral)   Resp 18   Ht 6\' 2"  (1.88 m)   Wt (!) 138.3 kg   SpO2 96%   BMI 39.16 kg/m   Physical Exam Vitals and nursing note reviewed.  Constitutional:      Appearance: Normal appearance.  HENT:     Head: Normocephalic and atraumatic.  Eyes:     General:        Right eye: No discharge.        Left eye: No discharge.     Conjunctiva/sclera: Conjunctivae normal.  Pulmonary:     Effort: Pulmonary effort is normal.  Musculoskeletal:     Comments: Tenderness palpation on the plantar aspect of the foot towards the anterior medial calcaneus.  There is surrounding erythema.  2+ dorsalis pedis pulse on the right.  Good cap  refill.  Full range of motion in the foot and toes.  Compartments are soft.  Skin:    General: Skin is warm and dry.     Findings: No rash.  Neurological:     General: No focal deficit present.     Mental Status: He is alert.  Psychiatric:        Mood and Affect: Mood normal.        Behavior: Behavior normal.    ED Results / Procedures / Treatments   Labs (all labs ordered are listed, but only abnormal results are displayed) Labs Reviewed - No data to display  EKG None  Radiology DG Foot Complete Right  Result Date: 10/06/2021 CLINICAL DATA:  Plantar region foot pain.  No known injury. EXAM: RIGHT FOOT COMPLETE - 3+ VIEW COMPARISON:  None FINDINGS: No evidence of fracture. No calcaneal spurring. No abnormality seen along the plantar aspect of the foot by radiography. Minimal midfoot degenerative change noted on the lateral view. IMPRESSION: No acute or traumatic finding. No cause of plantar foot pain identified. Electronically Signed   By: 10/08/2021 M.D.   On: 10/06/2021 10:35    Procedures Procedures   Medications Ordered in ED Medications - No data to display  ED Course  I have reviewed the triage vital signs and the nursing notes.  Pertinent labs & imaging results that were available during my care of the patient were reviewed by me and considered in my medical decision making (see chart for details).  Clinical Course as of 10/06/21 1046  Wed Oct 06, 2021  1041 I discussed this case with my attending physician who cosigned this note including patient's presenting symptoms, physical exam, and planned diagnostics and interventions. Attending physician stated agreement with plan or made changes to plan which were implemented.    [CF]    Clinical Course User Index [CF] Oct 08, 2021   MDM Rules/Calculators/A&P                          Gary Mcgee is a 35 y.o. male who presents to the emergency department for evaluation of right foot pain.  History  and physical exam is concerning for plantar fasciitis.  Given the popping sensation he felt completely this could yesterday we will get an x-ray to rule  out a fracture or dislocation.  I have low suspicion for any metatarsal pathology at this time.  Foot x-ray was negative for any fracture or dislocation.  Given the clinical scenario, educated the patient on Planter fasciitis with appropriate exercises that he can do.  I will also have him follow-up with his primary care doctor for further evaluation and possible orthopedic referral.  Instructed patient to start taking Naproxen on a scheduled basis.  I will also have him use appropriate RICE care.  Patient understood and expressed full understanding.  All questions and concerns addressed.  He is safe for discharge.   Final Clinical Impression(s) / ED Diagnoses Final diagnoses:  Plantar fasciitis of left foot    Rx / DC Orders ED Discharge Orders          Ordered    naproxen (NAPROSYN) 500 MG tablet  2 times daily        10/06/21 1045             Honor Loh Clyde, [place] 10/06/21 1047    Horton, Clabe Seal, DO 10/06/21 1527

## 2021-12-26 ENCOUNTER — Encounter (HOSPITAL_COMMUNITY): Payer: Self-pay | Admitting: Emergency Medicine

## 2021-12-26 ENCOUNTER — Emergency Department (HOSPITAL_COMMUNITY): Payer: Self-pay

## 2021-12-26 ENCOUNTER — Other Ambulatory Visit: Payer: Self-pay

## 2021-12-26 ENCOUNTER — Emergency Department (HOSPITAL_COMMUNITY)
Admission: EM | Admit: 2021-12-26 | Discharge: 2021-12-26 | Disposition: A | Discharge status: Home / Self Care | Payer: Self-pay | Attending: Student | Admitting: Student

## 2021-12-26 DIAGNOSIS — Z20822 Contact with and (suspected) exposure to covid-19: Secondary | ICD-10-CM | POA: Insufficient documentation

## 2021-12-26 DIAGNOSIS — J45909 Unspecified asthma, uncomplicated: Secondary | ICD-10-CM | POA: Insufficient documentation

## 2021-12-26 DIAGNOSIS — K529 Noninfective gastroenteritis and colitis, unspecified: Secondary | ICD-10-CM | POA: Insufficient documentation

## 2021-12-26 LAB — URINALYSIS, ROUTINE W REFLEX MICROSCOPIC
Bilirubin Urine: NEGATIVE
Glucose, UA: NEGATIVE mg/dL
Hgb urine dipstick: NEGATIVE
Ketones, ur: NEGATIVE mg/dL
Leukocytes,Ua: NEGATIVE
Nitrite: NEGATIVE
Protein, ur: NEGATIVE mg/dL
Specific Gravity, Urine: 1.01 (ref 1.005–1.030)
pH: 6.5 (ref 5.0–8.0)

## 2021-12-26 LAB — CBC
HCT: 48.7 % (ref 39.0–52.0)
Hemoglobin: 15.9 g/dL (ref 13.0–17.0)
MCH: 29.5 pg (ref 26.0–34.0)
MCHC: 32.6 g/dL (ref 30.0–36.0)
MCV: 90.4 fL (ref 80.0–100.0)
Platelets: 201 10*3/uL (ref 150–400)
RBC: 5.39 MIL/uL (ref 4.22–5.81)
RDW: 13.4 % (ref 11.5–15.5)
WBC: 7.1 10*3/uL (ref 4.0–10.5)
nRBC: 0 % (ref 0.0–0.2)

## 2021-12-26 LAB — COMPREHENSIVE METABOLIC PANEL
ALT: 93 U/L — ABNORMAL HIGH (ref 0–44)
AST: 194 U/L — ABNORMAL HIGH (ref 15–41)
Albumin: 4.2 g/dL (ref 3.5–5.0)
Alkaline Phosphatase: 52 U/L (ref 38–126)
Anion gap: 6 (ref 5–15)
BUN: 10 mg/dL (ref 6–20)
CO2: 28 mmol/L (ref 22–32)
Calcium: 8.5 mg/dL — ABNORMAL LOW (ref 8.9–10.3)
Chloride: 102 mmol/L (ref 98–111)
Creatinine, Ser: 0.84 mg/dL (ref 0.61–1.24)
GFR, Estimated: >60 mL/min (ref >60)
Glucose, Bld: 108 mg/dL — ABNORMAL HIGH (ref 70–99)
Potassium: 4.1 mmol/L (ref 3.5–5.1)
Sodium: 136 mmol/L (ref 135–145)
Total Bilirubin: 1.1 mg/dL (ref 0.3–1.2)
Total Protein: 7 g/dL (ref 6.5–8.1)

## 2021-12-26 LAB — RESP PANEL BY RT-PCR (FLU A&B, COVID) ARPGX2
Influenza A by PCR: NEGATIVE
Influenza B by PCR: NEGATIVE
SARS Coronavirus 2 by RT PCR: NEGATIVE

## 2021-12-26 LAB — HEPATITIS PANEL, ACUTE
HCV Ab: NONREACTIVE
Hep A IgM: NONREACTIVE
Hep B C IgM: NONREACTIVE
Hepatitis B Surface Ag: NONREACTIVE

## 2021-12-26 LAB — LIPASE, BLOOD: Lipase: 28 U/L (ref 11–51)

## 2021-12-26 MED ORDER — ONDANSETRON 4 MG PO TBDP: 4.0000 mg | ORAL_TABLET | Freq: Three times a day (TID) | ORAL | 0 refills | Status: AC | PRN

## 2021-12-26 MED ORDER — IOHEXOL 300 MG/ML  SOLN
100.0000 mL | Freq: Once | INTRAMUSCULAR | Status: AC | PRN
  Administered 2021-12-26: 100 mL via INTRAVENOUS

## 2021-12-26 NOTE — ED Triage Notes (Signed)
Patient c/o right upper abd pain that radiates into naval region. Per patient started yesterday. Denies any fevers or urinary symptoms but reports nausea, vomiting, and diarrhea. Emesis x3. Unable to count stools-per patient loose watery.

## 2021-12-26 NOTE — ED Notes (Signed)
Pt gone to CT 

## 2021-12-26 NOTE — Discharge Instructions (Addendum)
You were seen in the emergency room today for your nausea, vomiting, and diarrhea.  Your physical exam and CT scan were very reassuring.  You likely have an acute viral illness causing your vomiting and diarrhea.  You may take over-the-counter Imodium as needed and please increase your hydration at home taking care to avoid drinks that are red or purple in color as they can be alarming should you pass them in your diarrhea.  You were found to have fatty liver on your CT scan and your liver enzymes were elevated today.  Please follow-up with your primary care doctor in the next week for recheck of your liver enzyme levels and return to ER with any new severe symptoms.

## 2021-12-26 NOTE — ED Provider Notes (Signed)
[facility] EMERGENCY DEPARTMENT Provider Note   CSN: [id number] Arrival date & time: 12/26/21  1106     History  Chief Complaint  Patient presents with   Abdominal Pain    Gary Mcgee is a 36 y.o. male who presents with concern for generalized abdominal pain started yesterday with associated nausea, vomiting, and diarrhea with loose watery stool without melena or hematochezia.  Episode of NBNB emesis since yesterday.  >10 episodes of diarrhea in the last 24 hours. Decreased appetite, no fever.   I personally reviewed this patient's medical record.  Patient has a history of alcohol abuse and elevated liver enzymes in the past.  Has history of asthma and bipolar disorder.  On citalopram and trazodone daily.  No medications for nausea or diarrhea today at home.  HPI     Home Medications Prior to Admission medications   Medication Sig Start Date End Date Taking? Authorizing Provider  albuterol (VENTOLIN HFA) 108 (90 Base) MCG/ACT inhaler Inhale 1-2 puffs into the lungs every 6 (six) hours as needed for wheezing or shortness of breath. 04/25/21  Yes Bethann Berkshire, MD  ALLERGY RELIEF 10 MG tablet Take 10 mg by mouth daily. 09/10/21  Yes [provider]  citalopram (CELEXA) 20 MG tablet Take 20 mg by mouth daily. 03/29/20  Yes [provider]  ondansetron (ZOFRAN-ODT) 4 MG disintegrating tablet Take 1 tablet (4 mg total) by mouth every 8 (eight) hours as needed for nausea or vomiting. 12/26/21  Yes Edmund Holcomb, Eugene Gavia, PA-C  traZODone (DESYREL) 100 MG tablet Take 200 mg by mouth at bedtime. 02/17/20  Yes [provider]  doxycycline (VIBRAMYCIN) 100 MG capsule Take 1 capsule (100 mg total) by mouth 2 (two) times daily. One po bid x 7 days Patient not taking: Reported on 12/26/2021 04/25/21   Bethann Berkshire, MD  naproxen (NAPROSYN) 500 MG tablet Take 1 tablet (500 mg total) by mouth 2 (two) times daily. Patient not taking: Reported on 12/26/2021 10/06/21    Teressa Lower, PA-C  predniSONE (DELTASONE) 10 MG tablet Take 2 tablets (20 mg total) by mouth daily. Patient not taking: Reported on 12/26/2021 04/25/21   Bethann Berkshire, MD      Allergies    Shellfish allergy and Amoxicillin    Review of Systems   Review of Systems  Constitutional:  Positive for appetite change and chills. Negative for activity change and fatigue.  Respiratory: Negative.    Cardiovascular: Negative.   Gastrointestinal:  Positive for abdominal pain, diarrhea, nausea and vomiting.  Genitourinary: Negative.   Musculoskeletal: Negative.   Skin: Negative.   Neurological: Negative.    Physical Exam Updated Vital Signs BP (!) 138/95    Pulse 81    Temp 98.7 F (37.1 C) (Oral)    Resp 19    Ht 6\' 2"  (1.88 m)    Wt (!) 149.7 kg    SpO2 96%    BMI 42.37 kg/m  Physical Exam Vitals and nursing note reviewed.  Constitutional:      Appearance: He is not ill-appearing or toxic-appearing.  HENT:     Head: Normocephalic and atraumatic.     Mouth/Throat:     Mouth: Mucous membranes are moist.     Pharynx: No oropharyngeal exudate or posterior oropharyngeal erythema.  Eyes:     General:        Right eye: No discharge.        Left eye: No discharge.     Extraocular Movements: Extraocular movements  intact.     Conjunctiva/sclera: Conjunctivae normal.     Pupils: Pupils are equal, round, and reactive to light.  Cardiovascular:     Rate and Rhythm: Normal rate and regular rhythm.     Pulses: Normal pulses.  Pulmonary:     Effort: Pulmonary effort is normal. No respiratory distress.     Breath sounds: Normal breath sounds. No wheezing or rales.  Abdominal:     General: Bowel sounds are increased. There is no distension.     Palpations: Abdomen is soft.     Tenderness: There is generalized abdominal tenderness. There is no right CVA tenderness, left CVA tenderness, guarding or rebound.  Musculoskeletal:        General: No deformity.     Cervical back: Neck supple.   Skin:    General: Skin is warm and dry.     Capillary Refill: Capillary refill takes less than 2 seconds.  Neurological:     General: No focal deficit present.     Mental Status: He is alert and oriented to person, place, and time. Mental status is at baseline.  Psychiatric:        Mood and Affect: Mood normal.    ED Results / Procedures / Treatments   Labs (all labs ordered are listed, but only abnormal results are displayed) Labs Reviewed  COMPREHENSIVE METABOLIC PANEL - Abnormal; Notable for the following components:      Result Value   Glucose, Bld 108 (*)    Calcium 8.5 (*)    AST 194 (*)    ALT 93 (*)    All other components within normal limits  RESP PANEL BY RT-PCR (FLU A&B, COVID) ARPGX2  LIPASE, BLOOD  CBC  URINALYSIS, ROUTINE W REFLEX MICROSCOPIC  HEPATITIS PANEL, ACUTE    EKG None  Radiology CT Abdomen Pelvis W Contrast  Result Date: 12/26/2021 CLINICAL DATA:  Abdominal pain. EXAM: CT ABDOMEN AND PELVIS WITH CONTRAST TECHNIQUE: Multidetector CT imaging of the abdomen and pelvis was performed using the standard protocol following bolus administration of intravenous contrast. RADIATION DOSE REDUCTION: This exam was performed according to the departmental dose-optimization program which includes automated exposure control, adjustment of the mA and/or kV according to patient size and/or use of iterative reconstruction technique. CONTRAST:  OMNIPAQUE IOHEXOL 300 MG/ML  SOLN COMPARISON:  None. FINDINGS: Lower chest: No acute abnormality. Hepatobiliary: Marked hepatic steatosis. Liver measures 27 cm in length. Normal gallbladder. Pancreas: Unremarkable. No pancreatic ductal dilatation or surrounding inflammatory changes. Spleen: Normal in size without focal abnormality. Adrenals/Urinary Tract: Adrenal glands are unremarkable. Kidneys are normal, without renal calculi, focal lesion, or hydronephrosis. Bladder is unremarkable. Stomach/Bowel: Stomach is within normal  limits. No evidence of appendicitis. No evidence of bowel wall thickening, distention, or inflammatory changes. Vascular/Lymphatic: No significant vascular findings are present. No enlarged abdominal or pelvic lymph nodes. Reproductive: Status post hysterectomy. No adnexal masses. Other: No abdominal wall hernia or abnormality. No abdominopelvic ascites. Musculoskeletal: L5-S1 spondylosis IMPRESSION: Marked hepatic steatosis with hepatomegaly. No other acute abnormalities within the abdomen or pelvis. Electronically Signed   By: Ted Mcalpine M.D.   On: 12/26/2021 15:30    Procedures Procedures    Medications Ordered in ED Medications  iohexol (OMNIPAQUE) 300 MG/ML solution 100 mL (100 mLs Intravenous Contrast Given 12/26/21 1515)    ED Course/ Medical Decision Making/ A&P  Medical Decision Making 36 year old male who presents with concern for nausea, vomiting, and diarrhea since yesterday.  History of elevated liver enzymes in the past.  The differential diagnosis of diarrhea includes but is not limited to: Viral: norovirus/rotavirus; Bacterial-Campylobacter,Shigella, Salmonella, Escherichia coli, E. coli 0157:H7, Yersinia enterocolitica, Vibrio cholerae, Clostridium difficile. Parasitic- Giardia lamblia, Cryptosporidium,Entamoeba histolytica,Cyclospora, Microsporidium. Toxin- Staphylococcus aureus, Bacillus cereus.  Noninfectious causes include GI Bleed, Appendicitis, Mesenteric Ischemia, Diverticulitis, Adrenal Crisis, Thyroid Storm, Toxicologic exposures, Antibiotic or drug-associated, inflammatory bowel disease.  Vital signs are normal on intake.  Cardiopulmonary dam is normal, abdominal exam is with generalized tenderness palpation without focal tenderness.  Increased bowel sounds.  No CVAT.  Neurovascular tact in all 4 extremities.  Patient is well-appearing.  He has had 3 episodes of nonbloody, nonmelanotic watery diarrhea in the emergency department  today.  Problems Addressed: Gastroenteritis: acute illness or injury  Amount and/or Complexity of Data Reviewed External Data Reviewed: notes. Labs: ordered.    Details: CBC without leukocytosis or anemia.  CMP with elevated transaminases with AST/ALT 194/93 increased from baseline AST/ALT of 76/70.  He states his last drink of alcohol was over a month ago.  UA is normal, lipase is normal.  Respirate pathogen panel is negative. Radiology: ordered and independent interpretation performed.    Details: CT of abdomen pelvis was ordered by me.  Findings consistent with marked hepatic steatosis with hepatomegaly but without any other acute abnormality in the abdomen or pelvis.  Patient without gastroenterology care in the outpatient setting but does have a PCP.   Hepatitis panel pending at this time.  Patient otherwise tolerating p.o. in the emergency department and well-appearing.  Suspect acute viral gastroenteritis; will have patient follow-up in the outpatient setting for transaminitis and establishment of care with gastroenterology for his hepatic steatosis.  No further work-up warranted in the ER at this time given reassuring physical exam, vital signs, and laboratory studies.  We will send home with ODT Zofran as needed.  Dale voiced understanding with medical evaluation and treatment plan.  Each of his questions was answered to his expressed satisfaction.  Return precautions were given.  Patient is well-appearing, stable, and appropriate for discharge at this time.  This chart was dictated using voice recognition software, Dragon. Despite the best efforts of this provider to proofread and correct errors, errors may still occur which can change documentation meaning.   Final Clinical Impression(s) / ED Diagnoses Final diagnoses:  Gastroenteritis    Rx / DC Orders ED Discharge Orders          Ordered    ondansetron (ZOFRAN-ODT) 4 MG disintegrating tablet  Every 8 hours PRN         12/26/21 1614              Johnathyn Viscomi, Eugene GaviaRebekah R, PA-C 12/26/21 1741    Kommor, Wyn ForsterMadison, MD 12/27/21 1523

## 2022-04-06 ENCOUNTER — Encounter (HOSPITAL_COMMUNITY): Payer: Self-pay | Admitting: Emergency Medicine

## 2022-04-06 ENCOUNTER — Emergency Department (HOSPITAL_COMMUNITY)
Admission: EM | Admit: 2022-04-06 | Discharge: 2022-04-06 | Disposition: A | Discharge status: Home / Self Care | Payer: BC Managed Care – PPO | Attending: Emergency Medicine | Admitting: Emergency Medicine

## 2022-04-06 ENCOUNTER — Other Ambulatory Visit: Payer: Self-pay

## 2022-04-06 DIAGNOSIS — R0602 Shortness of breath: Secondary | ICD-10-CM | POA: Insufficient documentation

## 2022-04-06 DIAGNOSIS — J45909 Unspecified asthma, uncomplicated: Secondary | ICD-10-CM | POA: Insufficient documentation

## 2022-04-06 DIAGNOSIS — J4541 Moderate persistent asthma with (acute) exacerbation: Secondary | ICD-10-CM

## 2022-04-06 MED ORDER — PREDNISONE 50 MG PO TABS
60.0000 mg | ORAL_TABLET | Freq: Once | ORAL | Status: AC
  Administered 2022-04-06: 60 mg via ORAL
  Filled 2022-04-06: qty 1

## 2022-04-06 MED ORDER — PREDNISONE 20 MG PO TABS: ORAL_TABLET | ORAL | 0 refills | Status: AC

## 2022-04-06 MED ORDER — IPRATROPIUM-ALBUTEROL 0.5-2.5 (3) MG/3ML IN SOLN
3.0000 mL | Freq: Once | RESPIRATORY_TRACT | Status: AC
  Administered 2022-04-06: 3 mL via RESPIRATORY_TRACT
  Filled 2022-04-06: qty 3

## 2022-04-06 NOTE — ED Provider Notes (Signed)
?[facility] EMERGENCY DEPARTMENT ?Provider Note ? ? ?CSN: [id number] ?Arrival date & time: 04/06/22  0540 ? ?  ? ?History ? ?Chief Complaint  ?Patient presents with  ? Shortness of Breath  ? ? ?Gary Mcgee is a 36 y.o. male. ? ?Patient is a 36 year old male with history of asthma, seasonal allergies.  Patient presenting today with complaints of shortness of breath and cough.  This is worsened over the past 2 days.  He denies any fevers or chills.  He denies any chest pain or leg swelling.  He tells me he gets this every spring and believes it is related to weather change/pollen.  He does have an inhaler at home which she has tried with little relief. ? ?The history is provided by the patient.  ?Shortness of Breath ?Severity:  Moderate ?Onset quality:  Gradual ?Duration:  2 days ?Timing:  Constant ?Progression:  Worsening ?Chronicity:  New ?Relieved by:  Nothing ?Worsened by:  Nothing ? ?  ? ?Home Medications ?Prior to Admission medications   ?Medication Sig Start Date End Date Taking? Authorizing Provider  ?albuterol (VENTOLIN HFA) 108 (90 Base) MCG/ACT inhaler Inhale 1-2 puffs into the lungs every 6 (six) hours as needed for wheezing or shortness of breath. 04/25/21   Bethann Berkshire, MD  ?ALLERGY RELIEF 10 MG tablet Take 10 mg by mouth daily. 09/10/21   [provider]  ?citalopram (CELEXA) 20 MG tablet Take 20 mg by mouth daily. 03/29/20   [provider]  ?doxycycline (VIBRAMYCIN) 100 MG capsule Take 1 capsule (100 mg total) by mouth 2 (two) times daily. One po bid x 7 days ?Patient not taking: Reported on 12/26/2021 04/25/21   Bethann Berkshire, MD  ?naproxen (NAPROSYN) 500 MG tablet Take 1 tablet (500 mg total) by mouth 2 (two) times daily. ?Patient not taking: Reported on 12/26/2021 10/06/21   Teressa Lower, PA-C  ?ondansetron (ZOFRAN-ODT) 4 MG disintegrating tablet Take 1 tablet (4 mg total) by mouth every 8 (eight) hours as needed for nausea or vomiting. 12/26/21   Sponseller, Lupe Carney R,  PA-C  ?predniSONE (DELTASONE) 10 MG tablet Take 2 tablets (20 mg total) by mouth daily. ?Patient not taking: Reported on 12/26/2021 04/25/21   Bethann Berkshire, MD  ?traZODone (DESYREL) 100 MG tablet Take 200 mg by mouth at bedtime. 02/17/20   [provider]  ?   ? ?Allergies    ?Shellfish allergy and Amoxicillin   ? ?Review of Systems   ?Review of Systems  ?Respiratory:  Positive for shortness of breath.   ?All other systems reviewed and are negative. ? ?Physical Exam ?Updated Vital Signs ?BP (!) 143/81   Pulse 94   Temp 98.8 ?F (37.1 ?C)   Resp 20   Ht 6\' 2"  (1.88 m)   Wt (!) 156.5 kg   SpO2 92%   BMI 44.30 kg/m?  ?Physical Exam ?Vitals and nursing note reviewed.  ?Constitutional:   ?   General: He is not in acute distress. ?   Appearance: He is well-developed. He is not diaphoretic.  ?HENT:  ?   Head: Normocephalic and atraumatic.  ?Cardiovascular:  ?   Rate and Rhythm: Normal rate and regular rhythm.  ?   Heart sounds: No murmur heard. ?  No friction rub.  ?Pulmonary:  ?   Effort: Pulmonary effort is normal. No respiratory distress.  ?   Breath sounds: Examination of the right-middle field reveals wheezing. Examination of the left-middle field reveals wheezing. Wheezing present. No rales.  ?Abdominal:  ?  General: Bowel sounds are normal. There is no distension.  ?   Palpations: Abdomen is soft.  ?   Tenderness: There is no abdominal tenderness.  ?Musculoskeletal:     ?   General: Normal range of motion.  ?   Cervical back: Normal range of motion and neck supple.  ?   Right lower leg: No tenderness. No edema.  ?   Left lower leg: No tenderness. No edema.  ?Skin: ?   General: Skin is warm and dry.  ?Neurological:  ?   Mental Status: He is alert and oriented to person, place, and time.  ?   Coordination: Coordination normal.  ? ? ?ED Results / Procedures / Treatments   ?Labs ?(all labs ordered are listed, but only abnormal results are displayed) ?Labs Reviewed - No data to  display ? ?EKG ?None ? ?Radiology ?No results found. ? ?Procedures ?Procedures  ? ? ?Medications Ordered in ED ?Medications  ?predniSONE (DELTASONE) tablet 60 mg (has no administration in time range)  ?ipratropium-albuterol (DUONEB) 0.5-2.5 (3) MG/3ML nebulizer solution 3 mL (has no administration in time range)  ? ? ?ED Course/ Medical Decision Making/ A&P ? ?Patient presenting here with complaints of wheezing and shortness of breath is history of asthma and seasonal allergies and this seems to happen to him every spring.  He does have some wheezing on exam, but oxygen saturations are adequate and is feeling better after receiving prednisone and a DuoNeb.  Patient to be discharged with prednisone and continued use of his inhaler.  He is to return as needed if symptoms worsen or change. ? ?Final Clinical Impression(s) / ED Diagnoses ?Final diagnoses:  ?None  ? ? ?Rx / DC Orders ?ED Discharge Orders   ? ? None  ? ?  ? ? ?  ?Geoffery Lyons, MD ?04/06/22 [phone] ? ?

## 2022-04-06 NOTE — Discharge Instructions (Signed)
Begin taking prednisone as prescribed. ? ?Continue use of your inhaler, 2 puffs every 4 hours as needed. ? ?Return to the emergency department if you develop severe chest pain, worsening breathing, high fevers, or other new and concerning symptoms. ?

## 2022-04-06 NOTE — ED Triage Notes (Signed)
Pt c/o sob and headache for a couple of days.  ?

## 2022-05-12 DEATH — deceased

## 2023-05-25 IMAGING — DX DG FOOT COMPLETE 3+V*R*
3 series · 3 of 3 positions shown · non-contrast
Comparison: None

CLINICAL DATA: Plantar region foot pain.  No known injury.

EXAM:
RIGHT FOOT COMPLETE - 3+ VIEW

[foot ap]
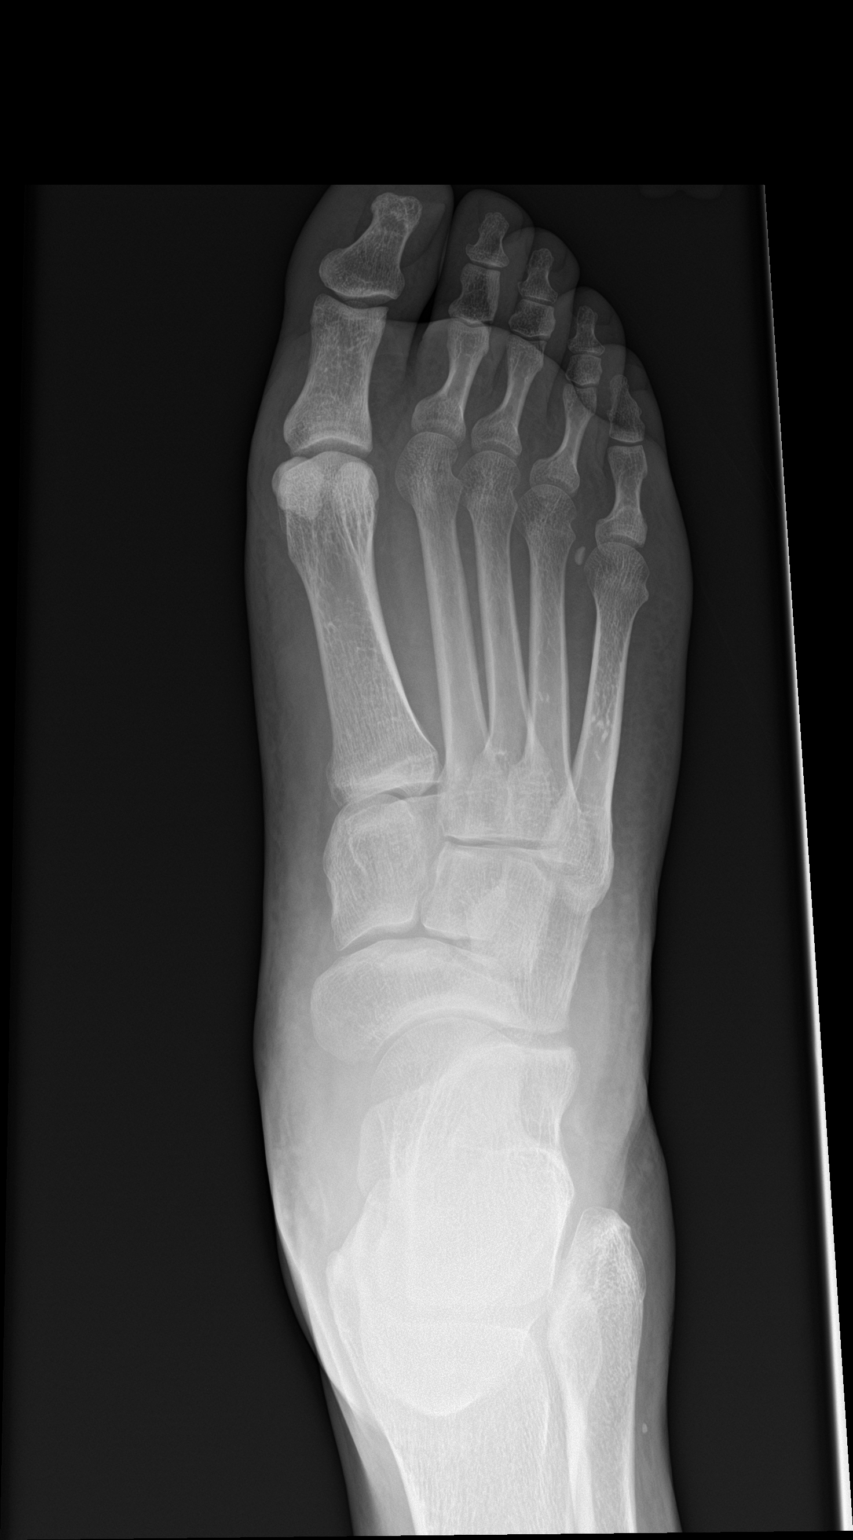

[foot obl]
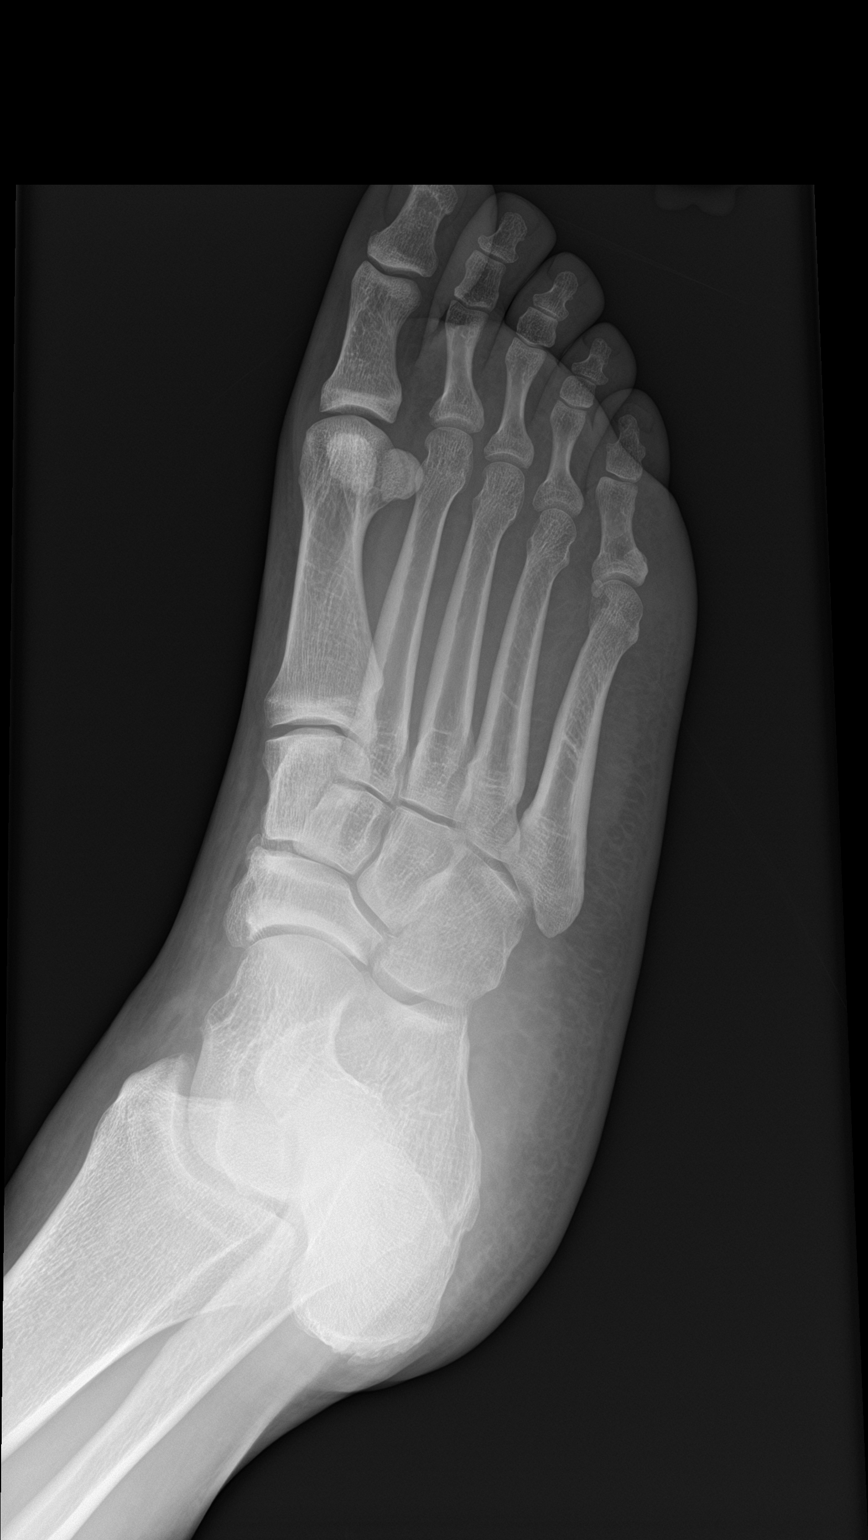

[foot lat]
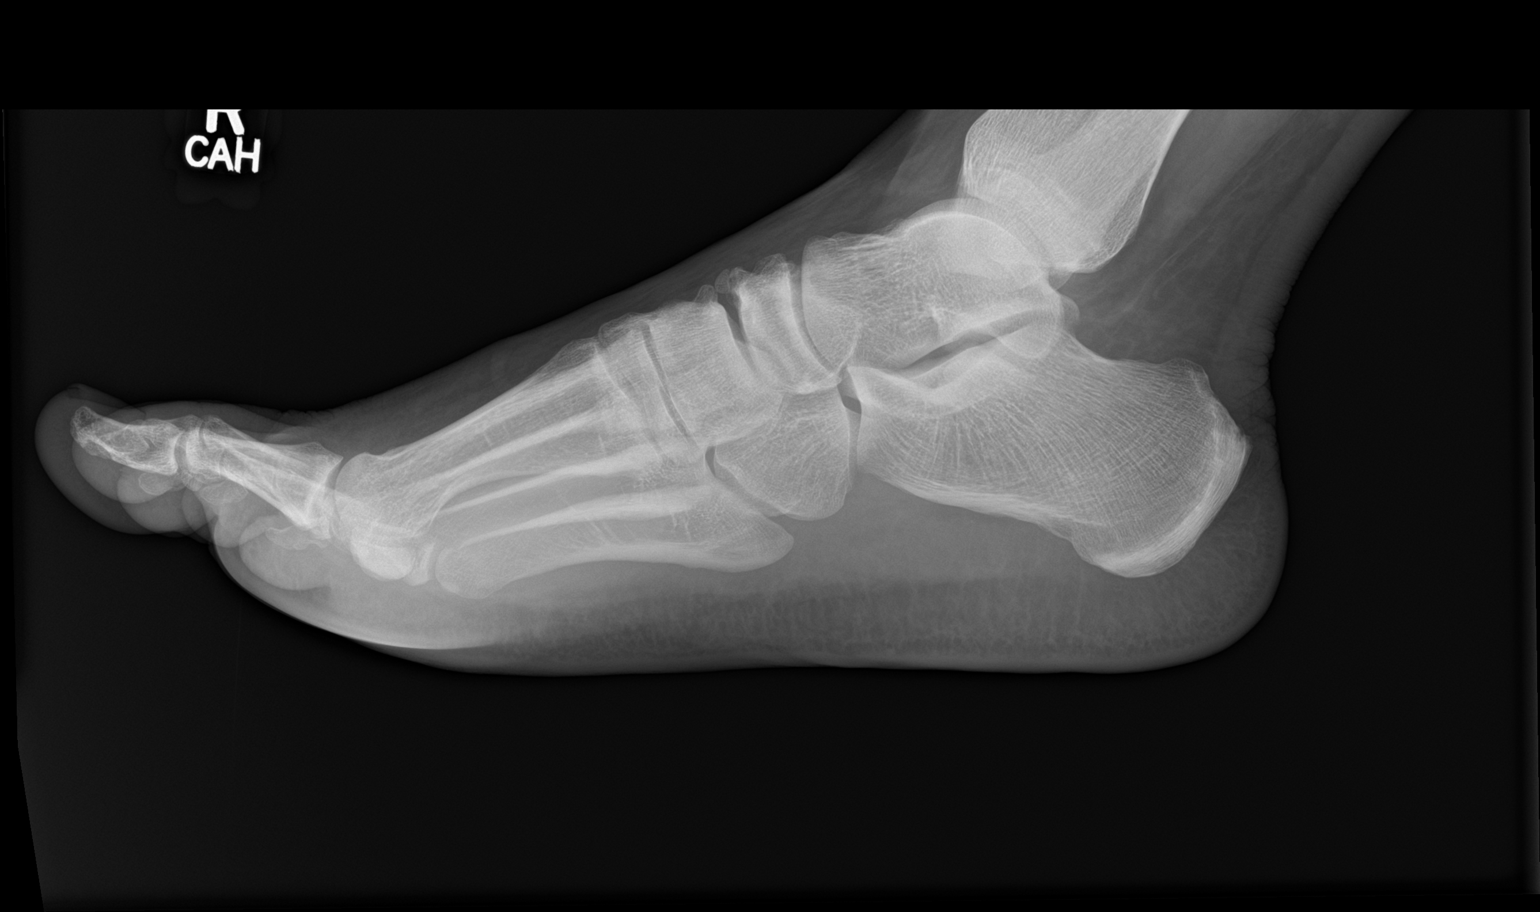

[3 of 3 positions shown; findings below may reference images not displayed]

FINDINGS: No evidence of fracture. No calcaneal spurring. No abnormality seen
along the plantar aspect of the foot by radiography. Minimal midfoot
degenerative change noted on the lateral view.
IMPRESSION: No acute or traumatic finding. No cause of plantar foot pain
identified.

## 2023-08-14 IMAGING — CT CT ABD-PELV W/ CM
2 of 5 series · 16 of 46 positions shown, 18 images · IV contrast (Omnipaque or Isovue)
Comparison: None.

CLINICAL DATA: Abdominal pain.

EXAM:
CT ABDOMEN AND PELVIS WITH CONTRAST
TECHNIQUE: Multidetector CT imaging of the abdomen and pelvis was performed
using the standard protocol following bolus administration of
intravenous contrast.

[Series 2: axial st · axial · 0.92mm/px · z∈[-299,+171]mm · 13 of 108 slices shown, 15 images]
[im 7/108  soft-tissue]
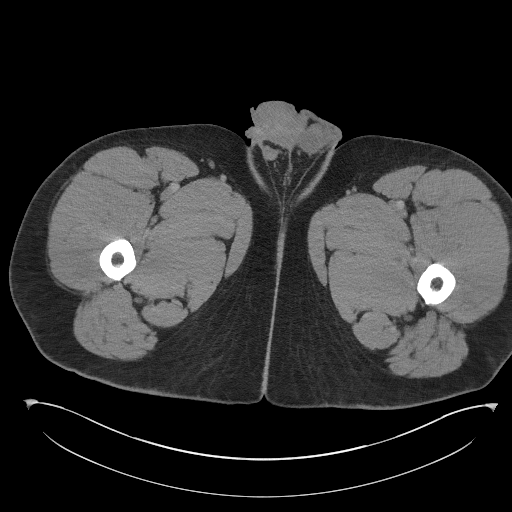
[im 7/108  bone]
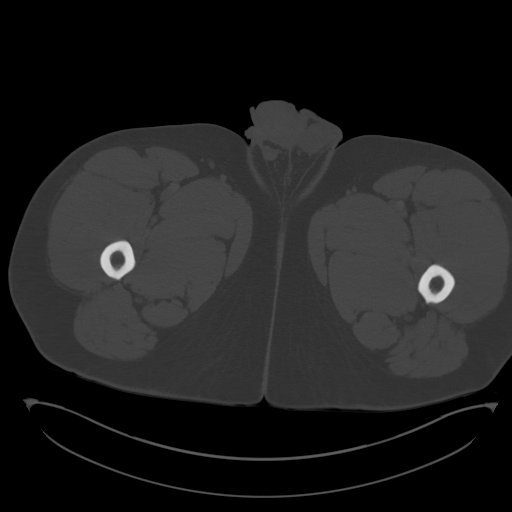
[im 13/108  soft-tissue]
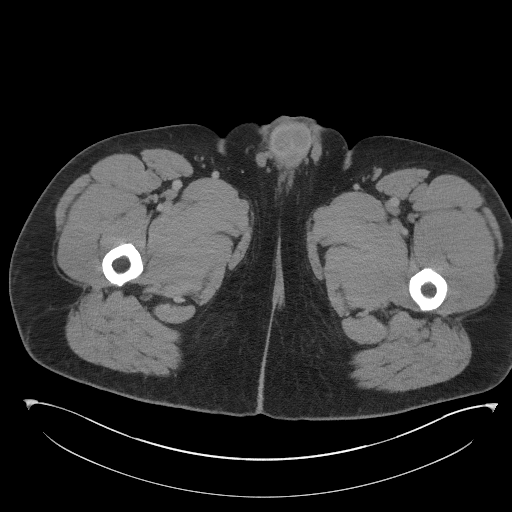
[im 26/108  soft-tissue]
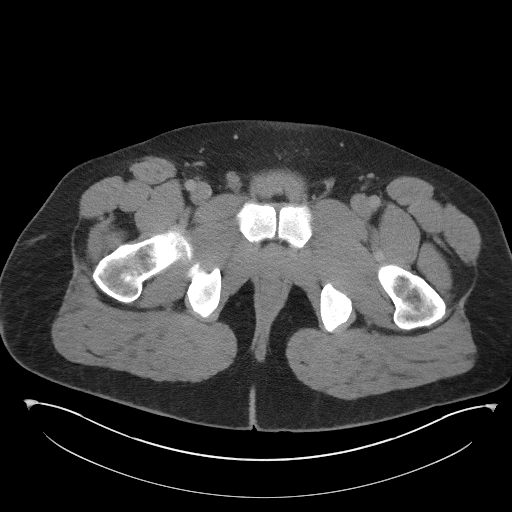
[im 32/108  soft-tissue]
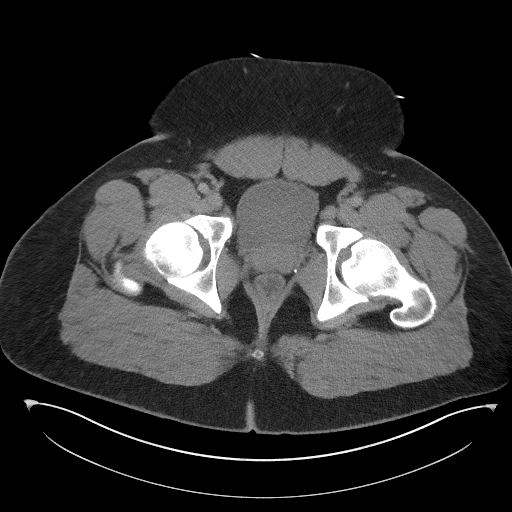
[im 38/108  soft-tissue]
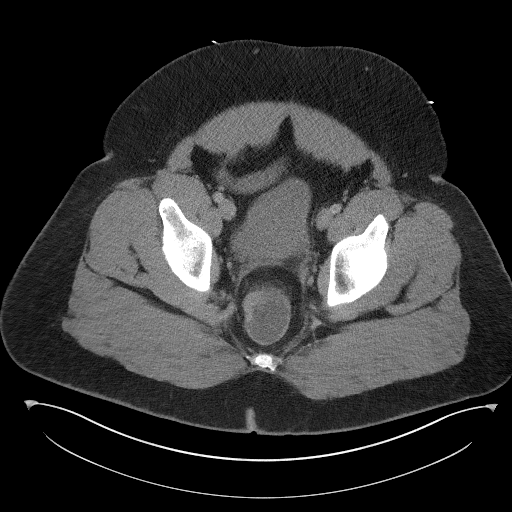
[im 45/108  soft-tissue]
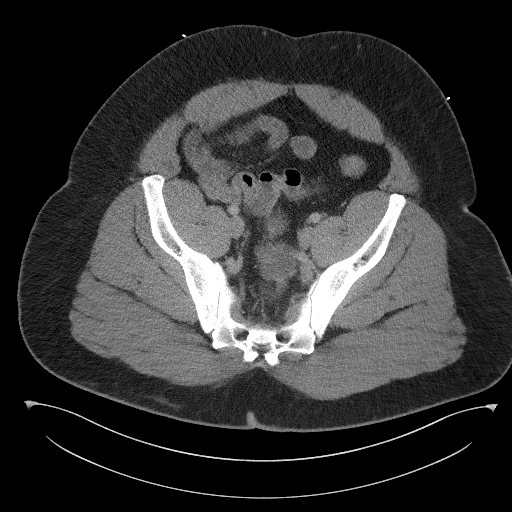
[im 57/108  soft-tissue]
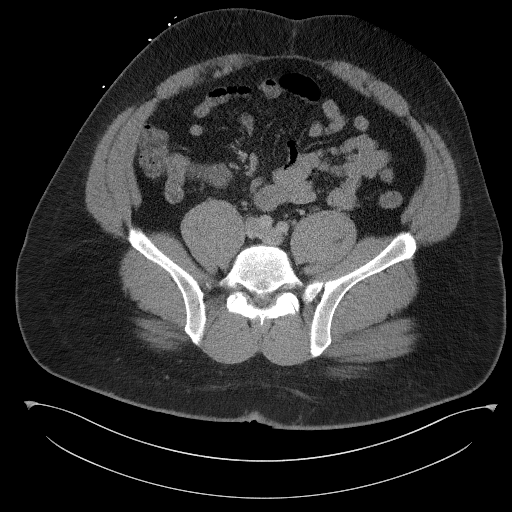
[im 63/108  soft-tissue]
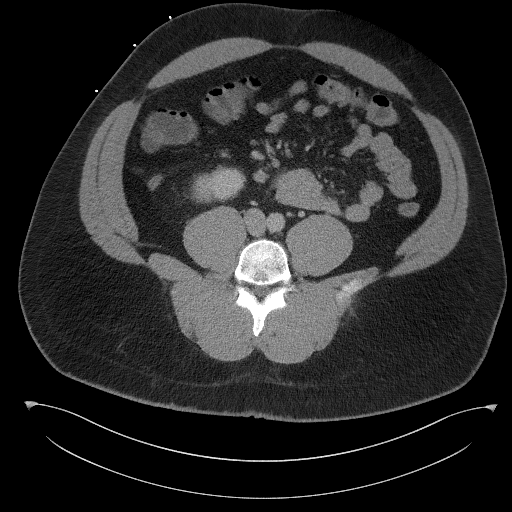
[im 70/108  soft-tissue]
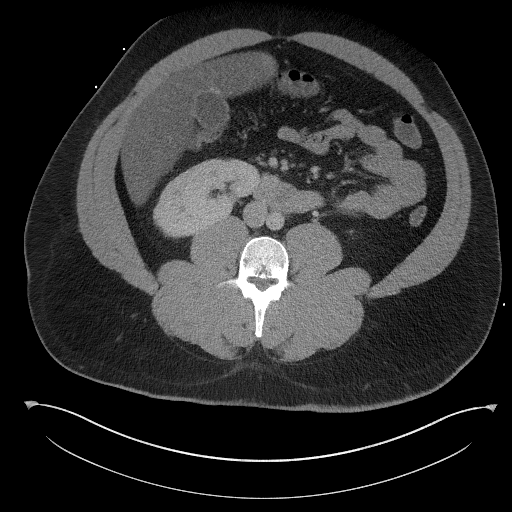
[im 70/108  bone]
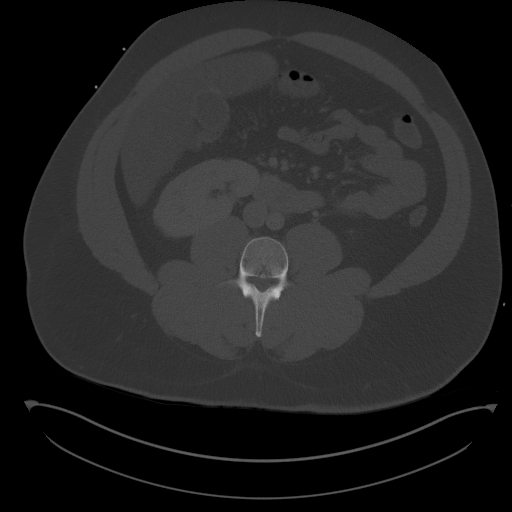
[im 76/108  soft-tissue]
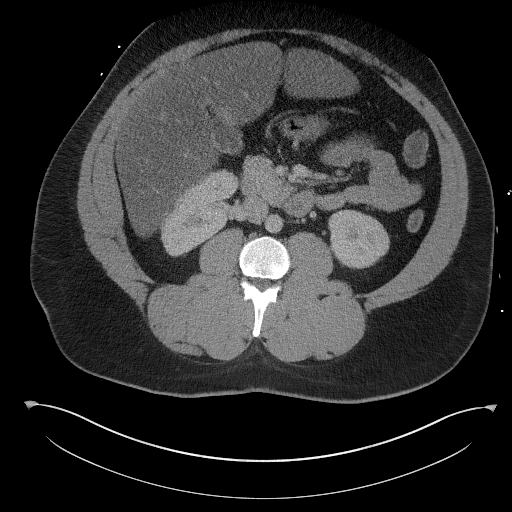
[im 82/108  soft-tissue]
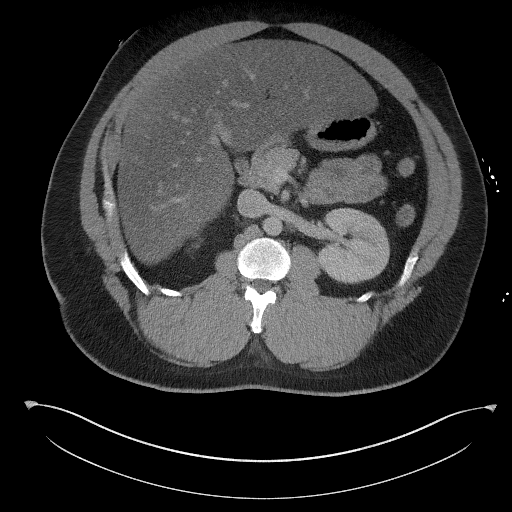
[im 95/108  soft-tissue]
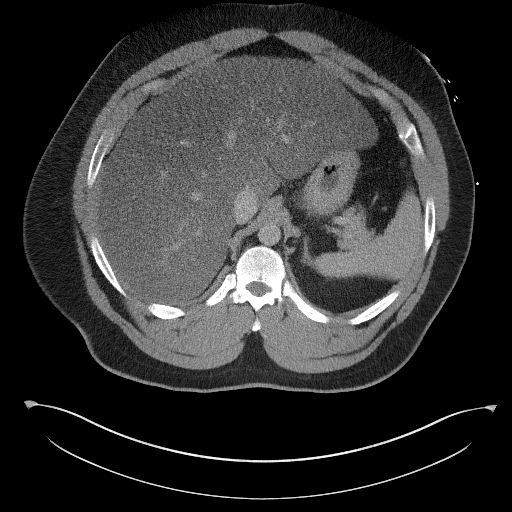
[im 101/108  soft-tissue]
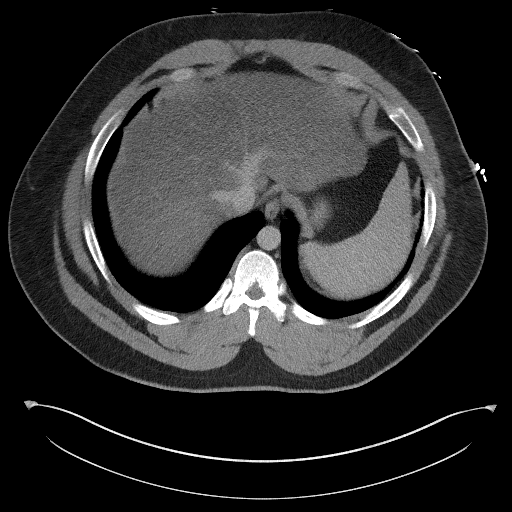

[Series 5: coronal st · coronal · 0.96mm/px · 3 of 136 slices shown]
[im 46/136  soft-tissue]
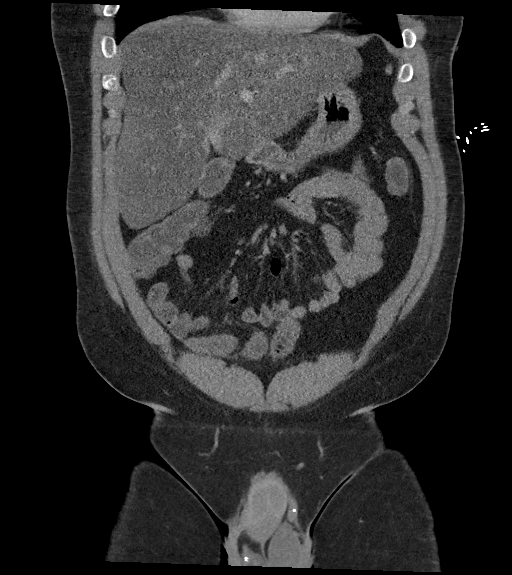
[im 61/136  soft-tissue]
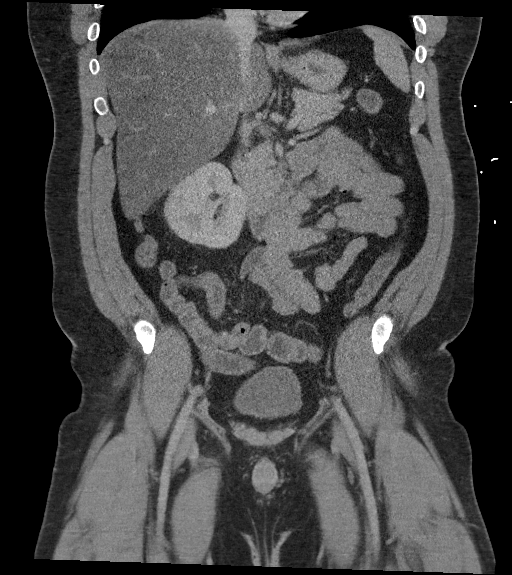
[im 76/136  soft-tissue]
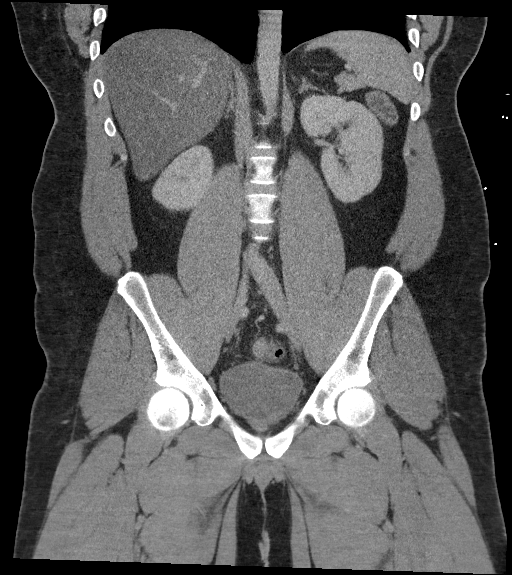

[16 of 46 positions shown; findings below may reference images not displayed]

RADIATION DOSE REDUCTION: This exam was performed according to the
departmental dose-optimization program which includes automated
exposure control, adjustment of the mA and/or kV according to
patient size and/or use of iterative reconstruction technique.

CONTRAST:  100mL OMNIPAQUE IOHEXOL 300 MG/ML  SOLN
FINDINGS: Lower chest: No acute abnormality.

Hepatobiliary: Marked hepatic steatosis. Liver measures 27 cm in
length. Normal gallbladder.

Pancreas: Unremarkable. No pancreatic ductal dilatation or
surrounding inflammatory changes.

Spleen: Normal in size without focal abnormality.

Adrenals/Urinary Tract: Adrenal glands are unremarkable. Kidneys are
normal, without renal calculi, focal lesion, or hydronephrosis.
Bladder is unremarkable.

Stomach/Bowel: Stomach is within normal limits. No evidence of
appendicitis. No evidence of bowel wall thickening, distention, or
inflammatory changes.

Vascular/Lymphatic: No significant vascular findings are present. No
enlarged abdominal or pelvic lymph nodes.

Reproductive: Status post hysterectomy. No adnexal masses.

Other: No abdominal wall hernia or abnormality. No abdominopelvic
ascites.

Musculoskeletal: L5-S1 spondylosis
IMPRESSION: Marked hepatic steatosis with hepatomegaly.

No other acute abnormalities within the abdomen or pelvis.
